# Patient Record
Sex: Male | Born: 1990 | Race: Black or African American | Hispanic: No | Marital: Single | Smoking: Never smoker
Health system: Southern US, Community
[De-identification: ages and names within clinical notes are randomized; demographics above are authoritative.]

## PROBLEM LIST (undated history)

## (undated) DIAGNOSIS — T7840XA Allergy, unspecified, initial encounter: Secondary | ICD-10-CM

## (undated) HISTORY — DX: Allergy, unspecified, initial encounter: T78.40XA

---

## 2013-08-15 ENCOUNTER — Emergency Department (HOSPITAL_COMMUNITY): Payer: Self-pay

## 2013-08-15 ENCOUNTER — Encounter (HOSPITAL_COMMUNITY): Payer: Self-pay | Admitting: Emergency Medicine

## 2013-08-15 ENCOUNTER — Emergency Department (HOSPITAL_COMMUNITY)
Admission: EM | Admit: 2013-08-15 | Discharge: 2013-08-15 | Disposition: A | Discharge status: Home / Self Care | Payer: Self-pay | Attending: Emergency Medicine | Admitting: Emergency Medicine

## 2013-08-15 DIAGNOSIS — Z79899 Other long term (current) drug therapy: Secondary | ICD-10-CM | POA: Insufficient documentation

## 2013-08-15 DIAGNOSIS — Y929 Unspecified place or not applicable: Secondary | ICD-10-CM | POA: Insufficient documentation

## 2013-08-15 DIAGNOSIS — S46909A Unspecified injury of unspecified muscle, fascia and tendon at shoulder and upper arm level, unspecified arm, initial encounter: Secondary | ICD-10-CM | POA: Insufficient documentation

## 2013-08-15 DIAGNOSIS — S4980XA Other specified injuries of shoulder and upper arm, unspecified arm, initial encounter: Secondary | ICD-10-CM | POA: Insufficient documentation

## 2013-08-15 DIAGNOSIS — X503XXA Overexertion from repetitive movements, initial encounter: Secondary | ICD-10-CM | POA: Insufficient documentation

## 2013-08-15 DIAGNOSIS — S4992XA Unspecified injury of left shoulder and upper arm, initial encounter: Secondary | ICD-10-CM

## 2013-08-15 DIAGNOSIS — Y939 Activity, unspecified: Secondary | ICD-10-CM | POA: Insufficient documentation

## 2013-08-15 MED ORDER — DIAZEPAM 5 MG PO TABS
5.0000 mg | ORAL_TABLET | Freq: Once | ORAL | Status: AC
  Administered 2013-08-15: 5 mg via ORAL
  Filled 2013-08-15: qty 1

## 2013-08-15 MED ORDER — CYCLOBENZAPRINE HCL 10 MG PO TABS: 10.0000 mg | ORAL_TABLET | Freq: Two times a day (BID) | ORAL | Status: DC | PRN

## 2013-08-15 MED ORDER — IBUPROFEN 800 MG PO TABS: 800.0000 mg | ORAL_TABLET | Freq: Three times a day (TID) | ORAL | Status: DC

## 2013-08-15 MED ORDER — KETOROLAC TROMETHAMINE 60 MG/2ML IM SOLN
60.0000 mg | Freq: Once | INTRAMUSCULAR | Status: AC
  Administered 2013-08-15: 60 mg via INTRAMUSCULAR
  Filled 2013-08-15: qty 2

## 2013-08-15 NOTE — Discharge Instructions (Signed)
Take ibuprofen as needed for pain. Take Flexeril as needed for muscle pain. Use sling as needed for comfort. Apply ice for pain. Refer to attached documents for more information.

## 2013-08-15 NOTE — ED Provider Notes (Signed)
CSN: [id number]     Arrival date & time 08/15/13  1419 History   First MD Initiated Contact with Patient 08/15/13 1436     Chief Complaint  Patient presents with  . Shoulder Pain     (Consider location/radiation/quality/duration/timing/severity/associated sxs/prior Treatment) HPI Comments: Patient is a 23 year old male who presents with left shoulder pain that started this morning. Symptoms started gradually and remained constant since the onset. The pain is described as a soreness and is moderate. No radiation. Movement of the shoulder makes the pain worse. Patient denies any injury. He did not take anything at home for pain. No alleviating factors. Patient denies any previous trauma to the left shoulder joint.    History reviewed. No pertinent past medical history. History reviewed. No pertinent past surgical history. No family history on file. History  Substance Use Topics  . Smoking status: Never Smoker   . Smokeless tobacco: Not on file  . Alcohol Use: No    Review of Systems  Constitutional: Negative for fever, chills and fatigue.  HENT: Negative for trouble swallowing.   Eyes: Negative for visual disturbance.  Respiratory: Negative for shortness of breath.   Cardiovascular: Negative for chest pain and palpitations.  Gastrointestinal: Negative for nausea, vomiting, abdominal pain and diarrhea.  Genitourinary: Negative for dysuria and difficulty urinating.  Musculoskeletal: Positive for arthralgias. Negative for neck pain.  Skin: Negative for color change.  Neurological: Negative for dizziness and weakness.  Psychiatric/Behavioral: Negative for dysphoric mood.      Allergies  Review of patient's allergies indicates no known allergies.  Home Medications   Current Outpatient Rx  Name  Route  Sig  Dispense  Refill  . Multiple Vitamins-Minerals (MULTIVITAMIN PO)   Oral   Take 1 tablet by mouth daily.          BP 137/63  Pulse 60  Temp(Src) 98.5 F (36.9 C)  (Oral)  Resp 20  Ht 5\' 11"  (1.803 m)  Wt 275 lb (124.739 kg)  BMI 38.37 kg/m2  SpO2 99% Physical Exam  Nursing note and vitals reviewed. Constitutional: He is oriented to person, place, and time. He appears well-developed and well-nourished. No distress.  HENT:  Head: Normocephalic and atraumatic.  Eyes: Conjunctivae are normal.  Neck: Normal range of motion.  Cardiovascular: Normal rate and regular rhythm.  Exam reveals no gallop and no friction rub.   No murmur heard. Pulmonary/Chest: Effort normal and breath sounds normal. He has no wheezes. He has no rales. He exhibits no tenderness.  Musculoskeletal:  Limited ROM of left shoulder due to pain. No obvious deformity. Anterior left shoulder tenderness to palpation.   Neurological: He is alert and oriented to person, place, and time. Coordination normal.  Speech is goal-oriented. Moves limbs without ataxia.   Skin: Skin is warm and dry.  Psychiatric: He has a normal mood and affect. His behavior is normal.    ED Course  Procedures (including critical care time) Labs Review Labs Reviewed - No data to display Imaging Review Dg Shoulder Left  08/15/2013   CLINICAL DATA:  Left shoulder pain.  EXAM: LEFT SHOULDER - 2+ VIEW  COMPARISON:  None.  FINDINGS: There is no evidence of fracture or dislocation. There is no evidence of arthropathy or other focal bone abnormality. Soft tissues are unremarkable.  IMPRESSION: Negative.   Electronically Signed   By: Charlett Nose M.D.   On: 08/15/2013 15:27     EKG Interpretation None      MDM  Final diagnoses:  Injury of left shoulder    4:24 PM Xray unremarkable for acute changes. Vitals stable and patient afebrile. Patient likely has muscle strain. Patient wil be discharged with a sling for comfort, ibuprofen and flexeril.     Emilia BeckKaitlyn Dejour Vos, PA-C 08/16/13 1024

## 2013-08-15 NOTE — ED Notes (Signed)
Pt presents to department for evaluation of L shoulder pain. Pt was lifting heavy boxes at work last night. Now states increased pain, increases with movement. 8/10 pain at the time. Pt is alert and oriented x4.

## 2013-08-15 NOTE — ED Notes (Signed)
Pt to xray

## 2013-08-16 NOTE — ED Provider Notes (Signed)
Medical screening examination/treatment/procedure(s) were performed by non-physician practitioner and as supervising physician I was immediately available for consultation/collaboration.  Lacoya Wilbanks T Serria Sloma, MD 08/16/13 1604 

## 2013-09-28 ENCOUNTER — Encounter (HOSPITAL_COMMUNITY): Payer: Self-pay | Admitting: Emergency Medicine

## 2013-09-28 ENCOUNTER — Emergency Department (HOSPITAL_COMMUNITY)
Admission: EM | Admit: 2013-09-28 | Discharge: 2013-09-28 | Disposition: A | Discharge status: Home / Self Care | Payer: Self-pay | Attending: Emergency Medicine | Admitting: Emergency Medicine

## 2013-09-28 DIAGNOSIS — R42 Dizziness and giddiness: Secondary | ICD-10-CM | POA: Insufficient documentation

## 2013-09-28 DIAGNOSIS — R51 Headache: Secondary | ICD-10-CM | POA: Insufficient documentation

## 2013-09-28 LAB — BASIC METABOLIC PANEL
BUN: 16 mg/dL (ref 6–23)
CALCIUM: 9.6 mg/dL (ref 8.4–10.5)
CO2: 25 mEq/L (ref 19–32)
CREATININE: 0.93 mg/dL (ref 0.50–1.35)
Chloride: 101 mEq/L (ref 96–112)
GFR calc non Af Amer: >90 mL/min (ref >90)
GLUCOSE: 98 mg/dL (ref 70–99)
POTASSIUM: 4.3 meq/L (ref 3.7–5.3)
Sodium: 138 mEq/L (ref 137–147)

## 2013-09-28 LAB — CBC
HCT: 43.9 % (ref 39.0–52.0)
Hemoglobin: 15.5 g/dL (ref 13.0–17.0)
MCH: 30.8 pg (ref 26.0–34.0)
MCHC: 35.3 g/dL (ref 30.0–36.0)
MCV: 87.1 fL (ref 78.0–100.0)
PLATELETS: 251 10*3/uL (ref 150–400)
RBC: 5.04 MIL/uL (ref 4.22–5.81)
RDW: 13.3 % (ref 11.5–15.5)
WBC: 3.7 10*3/uL — ABNORMAL LOW (ref 4.0–10.5)

## 2013-09-28 MED ORDER — SODIUM CHLORIDE 0.9 % IV BOLUS (SEPSIS): 1000.0000 mL | Freq: Once | INTRAVENOUS | Status: DC

## 2013-09-28 NOTE — ED Notes (Signed)
Pt given a Pitcher of water to drink .

## 2013-09-28 NOTE — Discharge Instructions (Signed)
Dizziness °Dizziness is a common problem. It is a feeling of unsteadiness or lightheadedness. You may feel like you are about to faint. Dizziness can lead to injury if you stumble or fall. A person of any age group can suffer from dizziness, but dizziness is more common in older adults. °CAUSES  °Dizziness can be caused by many different things, including: °· Middle ear problems. °· Standing for too long. °· Infections. °· An allergic reaction. °· Aging. °· An emotional response to something, such as the sight of blood. °· Side effects of medicines. °· Fatigue. °· Problems with circulation or blood pressure. °· Excess use of alcohol, medicines, or illegal drug use. °· Breathing too fast (hyperventilation). °· An arrhythmia or problems with your heart rhythm. °· Low red blood cell count (anemia). °· Pregnancy. °· Vomiting, diarrhea, fever, or other illnesses that cause dehydration. °· Diseases or conditions such as Parkinson's disease, high blood pressure (hypertension), diabetes, and thyroid problems. °· Exposure to extreme heat. °DIAGNOSIS  °To find the cause of your dizziness, your caregiver may do a physical exam, lab tests, radiologic imaging scans, or an electrocardiography test (ECG).  °TREATMENT  °Treatment of dizziness depends on the cause of your symptoms and can vary greatly. °HOME CARE INSTRUCTIONS  °· Drink enough fluids to keep your urine clear or pale yellow. This is especially important in very hot weather. In the elderly, it is also important in cold weather. °· If your dizziness is caused by medicines, take them exactly as directed. When taking blood pressure medicines, it is especially important to get up slowly. °· Rise slowly from chairs and steady yourself until you feel okay. °· In the morning, first sit up on the side of the bed. When this seems okay, stand slowly while holding onto something until you know your balance is fine. °· If you need to stand in one place for a long time, be sure to  move your legs often. Tighten and relax the muscles in your legs while standing. °· If dizziness continues to be a problem, have someone stay with you for a day or two. Do this until you feel you are well enough to stay alone. Have the person call your caregiver if he or she notices changes in you that are concerning. °· Do not drive or use heavy machinery if you feel dizzy. °· Do not drink alcohol. °SEEK IMMEDIATE MEDICAL CARE IF:  °· Your dizziness or lightheadedness gets worse. °· You feel nauseous or vomit. °· You develop problems with talking, walking, weakness, or using your arms, hands, or legs. °· You are not thinking clearly or you have difficulty forming sentences. It may take a friend or family member to determine if your thinking is normal. °· You develop chest pain, abdominal pain, shortness of breath, or sweating. °· Your vision changes. °· You notice any bleeding. °· You have side effects from medicine that seems to be getting worse rather than better. °MAKE SURE YOU:  °· Understand these instructions. °· Will watch your condition. °· Will get help right away if you are not doing well or get worse. °Document Released: 11/21/2000 Document Revised: 08/20/2011 Document Reviewed: 12/15/2010 °ExitCare® Patient Information ©2014 [employer]. ° ° °Emergency Department Resource Guide °1) Find a Doctor and Pay Out of Pocket °Although you won't have to find out who is covered by your insurance plan, it is a good idea to ask around and get recommendations. You will then need to call the office and see   if the doctor you have chosen will accept you as a new patient and what types of options they offer for patients who are self-pay. Some doctors offer discounts or will set up payment plans for their patients who do not have insurance, but you will need to ask so you aren't surprised when you get to your appointment. ° °2) Contact Your Local Health Department °Not all health departments have doctors that can see  patients for sick visits, but many do, so it is worth a call to see if yours does. If you don't know where your local health department is, you can check in your phone book. The CDC also has a tool to help you locate your state's health department, and many state websites also have listings of all of their local health departments. ° °3) Find a Walk-in Clinic °If your illness is not likely to be very severe or complicated, you may want to try a walk in clinic. These are popping up all over the country in pharmacies, drugstores, and shopping centers. They're usually staffed by nurse practitioners or physician assistants that have been trained to treat common illnesses and complaints. They're usually fairly quick and inexpensive. However, if you have serious medical issues or chronic medical problems, these are probably not your best option. ° °No Primary Care Doctor: °- Call [facility] at  832-8000 - they can help you locate a primary care doctor that  accepts your insurance, provides certain services, etc. °- Physician Referral Service- 1-800-533-3463 ° °Chronic Pain Problems: °Organization         Address  Phone   Notes  °[facility]  (336) 297-2271 Patients need to be referred by their primary care doctor.  ° °Medication Assistance: °Organization         Address  Phone   Notes  °[place] [facility] [address] °[place], [place] [address] (336) 641-8030 --Must be a resident of [place] °-- Must have NO insurance coverage whatsoever (no Medicaid/ Medicare, etc.) °-- The pt. MUST have a primary care doctor that directs their care regularly and follows them in the community °  °[facility]  (866) 331-1348   °[employer]  (888) 892-1162   ° °Agencies that provide inexpensive medical care: °Organization         Address  Phone   Notes  °[facility]  (336) 832-8035   °[facility]    (336) 832-7272   °[facility] [address] °[place], [place] [address] (336) 832-4777   °[facility] [address], °[place] (336) 271-4999   °[facility]    (336) 373-0678   °[facility]    (336) 272-1050   °[facility] ° [address], [place] Phone:  (336) 832-4444, Fax:  (336) 832-4440 Hours of Operation:  9 am - 6 pm, M-F.  Also accepts Medicaid/Medicare and self-pay.  °[facility] ° [address], [place] Phone: (336) 832-3150, Fax: (336) 832-3151. Hours of Operation:  8:30 am - 5:30 pm, M-F.  Also accepts Medicaid and self-pay.  °[facility] [place] [address], [place] Phone: (336) 878-6027   °[facility] [address], [place], [place] (336)723-1848, Ext. 123 Mondays & Thursdays: 7-9 AM.  First 15 patients are seen on a first come, first serve basis. °  ° °Medicaid-accepting [place] Providers: ° °  Organization         Address  Phone   Notes  °[facility] [address], [place] (336) 641-2100 Also accepts self-pay patients.  °[facility] [address], [place] ° (336) 856-9996   °[facility] [address], [place] (336) 288-8857   °[facility] [address] [place] [address], [place] (336) 299-7000   °Veita Bland [address], [place]  ° (336) 373-1557 Only accepts [place] Access Medicaid patients after they have their name applied to their card.  ° °Self-Pay (no insurance) in [place]: ° °Organization         Address  Phone   Notes  °Sickle Cell Patients, [facility] [address], [place] (336) 832-1970   °[facility] [address], [place] (336) 832-4400   ° [facility] ° [address], [place] (336) 992-4800   °[facility]/Dr. Osei-Bonsu °  [address] [place] [address], [place] or [address], [place] (336) 841-8500 Phone number for both [place] and [place] locations is the same.  °[facility] [address], [place] (336) 299-0000   °[facility] [address] [place] [address], [place] or [address] (336) 852-7530 °(336) 878-2260   °[facility] [address], [place] (336) 350-1642, phone; (336) 294-5005, fax Sees patients 1st and 3rd Saturday of every month.  Must not qualify for public or private insurance (i.e. Medicaid, Medicare, [place] Health Choice, Veterans' Benefits) • Household income should be no more than 200% of the poverty level •The clinic cannot treat you if you are pregnant or think you are pregnant • Sexually transmitted diseases are not treated at the clinic.  ° ° °Dental Care: °Organization         Address  Phone  Notes  °[facility] [address], [place] (336) 641-6152 Accepts children up to age 21 who are enrolled in Medicaid or [place] Health Choice; pregnant women with a Medicaid card; and children who have applied for Medicaid or [place] Health Choice, but were declined, whose parents can pay a reduced fee at time of service.  °[facility] [place]  [address], [place] (336) 641-7733 Accepts children up to age 21 who are enrolled in Medicaid or [place] Health Choice; pregnant women with a Medicaid card; and children who have applied for Medicaid or [place] Health Choice, but were declined, whose parents can pay a reduced fee at time of service.  °[facility] ° [address], [place] (336) 641-4533 Patients are seen by appointment only. Walk-ins are not accepted. [facility] will see patients 18 years of age and older. °Monday - Tuesday (8am-5pm) °Most Wednesdays (8:30-5pm) °$30 per visit, cash only  °[facility] ° [address], [place] (336) 641-4533 Patients are seen by appointment only. Walk-ins are not accepted. [facility] will see patients 18 years of age and older. °One Wednesday Evening (Monthly: Volunteer Based).  $30 per visit, cash only  °[facility]  (919) 537-3737 for adults; Children under age 4, call Graduate Pediatric Dentistry at (919) [phone]. Children aged 4-14, please call (919) [phone] to request a pediatric application. ° Dental services   are provided in all areas of dental care including fillings, crowns and bridges, complete and partial dentures, implants, gum treatment, root canals, and extractions. Preventive care is also provided. Treatment is provided to both adults and children. °Patients are selected via a lottery and there is often a waiting list. °  °[facility] [address], °[place] ° (336) 763-8833 [email or web address] °  °[facility] [address], [place], [place] (336)723-1848, Ext. 123 Second and Fourth Thursday of each month, opens at 6:30 AM; Clinic ends at 9 AM.  Patients are seen on a first-come first-served basis, and a limited number are seen during each clinic.  ° °[facility] ° [address], [place], [place] (336) 723-7904   Eligibility Requirements °You must have lived in [place], [place], or [place] counties for at least the last three months. °  You cannot be eligible for state or federal sponsored healthcare insurance, including Veterans Administration, Medicaid, or Medicare. °  You generally cannot be eligible for healthcare insurance through your employer.  °  How to apply: °Eligibility screenings are held every Tuesday and Wednesday afternoon from 1:00 pm until 4:00 pm. You do not need an appointment for the interview!  °[facility] [address], [place], [place] 336-631-2330   °[facility]  336-342-8273   °[facility]   336-703-3100   ° [facility]  336-570-6415   ° °Behavioral Health Resources in the Community: °Intensive Outpatient Programs °Organization         Address  Phone  Notes  °[facility] [address], [place], [place] 336-878-6098   °[facility] [address], [place], [place] 336-832-9800   °[facility] [address], [place], [place] ° 336-882-2125   °[facility] [address],  °[place], [place] 1-800-853-5163 or 336-641-4981   °Substance Abuse Resources °Organization         Address  Phone  Notes  °[facility]  336-882-2125   °[facility]  336-784-9470   °[facility]  336-285-9073   °[facility]  336-845-3988   °Residential & Outpatient Substance Abuse Program  1-800-659-3381   °Psychological Services °Organization         Address  Phone  Notes  °[facility]  336- 832-9600   °[facility]  336- 378-7881   °[facility] [address], [place] 1-800-853-5163 or 336-641-4981   ° °Mobile Crisis Teams °Organization         Address  Phone  Notes  °[facility], Mobile Crisis Care Unit  1-877-626-1772   °Assertive °[facility] ° [address] [place], [place] 336-834-9664   °Sharon DeEsch [address] °[place] 336-554-5454   ° °Self-Help/Support Groups °Organization         Address  Phone             Notes  °[facility] - variety of support groups  336- 373-1402 Call for more information  °Narcotics Anonymous (NA), [facility] [address], °[place]  2 meetings at this location  ° °Residential Treatment Programs °Organization         Address  Phone  Notes  °[facility] [address],    °   1-866-801-8205   °[facility] ° [address], [place],  704-293-8524   °  [facility] [address], [place] 336-845-3988 Admissions: 8am-3pm M-F  °[facility] [address]    °[place], [place] 336-841-1104   °[facility] [address], [place], [place] 336-379-7146   °[facility] [address]  °[place], [place] 336-285-9073   °[facility] Intensive Outpatient [address], [place], [place] 336-852-3033   °[facility] [address]  °[place], [place] 1-877-615-2722 or 336-784-9470   °[facility] ([facility] [address] [place], [place] 336-227-7417 Accepts Medicaid  °[facility] [address]  ° [place] 1-800-659-3381 Substance Abuse/Addiction Treatment  ° °[place] Behavioral Health Resources °Organization         Address  Phone  Notes  °[facility]  (888) 581-9988   °Julie Brannon, PhD [address] [place], [employer] [place]   (336) 349-5553 or (336) 951-0000   °[facility]   [address] °[place], [place] (336) 349-4454   °[facility] [address], [place], [place] (336) 342-8316 Insurance/Medicaid/sponsorship through [facility]  °[facility] [address]                                    [place], [place] (336) 342-8316 Therapy/tele-psych/case  °[facility] [address]  ° [place], [place] (336) 349-2233    °Dr. Arfeen  (336) 349-4544   °[facility] [place]  [employer] [place] [facility] 1) [address], [place] °2) [address], [place] °3)  [address], [place] (336) 349-3220 °(336) 342-7768 ° °(336) 342-8140   °[place] [facility] (336) 342-1394 or (336) 342-3537 (After Hours)    ° ° ° °

## 2013-09-28 NOTE — ED Notes (Signed)
Pt reports headache and dizziness with standing for the last week. Pt states his symptoms started while standing at work. States he took a few days off work hoping to feel better but again had symptoms of dizziness so much so that he fell today. Pt denies LOC. Pt currently awake, alert, oriented x4, NAD at present.

## 2013-09-29 NOTE — ED Provider Notes (Signed)
CSN: [id number]     Arrival date & time 09/28/13  1427 History   First MD Initiated Contact with Patient 09/28/13 1730     Chief Complaint  Patient presents with  . Headache  . Dizziness     (Consider location/radiation/quality/duration/timing/severity/associated sxs/prior Treatment) Patient is a 23 y.o. male presenting with headaches and dizziness. The history is provided by the patient.  Headache Associated symptoms: dizziness   Associated symptoms: no abdominal pain, no cough, no fever and no vomiting   Dizziness Quality:  Lightheadedness Severity:  Mild Onset quality:  Gradual Duration:  1 week Timing:  Intermittent Progression:  Worsening Chronicity:  New Context: physical activity and standing up   Relieved by:  Lying down Worsened by:  Nothing tried Associated symptoms: headaches   Associated symptoms: no shortness of breath and no vomiting     History reviewed. No pertinent past medical history. History reviewed. No pertinent past surgical history. History reviewed. No pertinent family history. History  Substance Use Topics  . Smoking status: Never Smoker   . Smokeless tobacco: Not on file  . Alcohol Use: No    Review of Systems  Constitutional: Negative for fever.  Respiratory: Negative for cough and shortness of breath.   Gastrointestinal: Negative for vomiting and abdominal pain.  Neurological: Positive for dizziness and headaches.  All other systems reviewed and are negative.     Allergies  Review of patient's allergies indicates no known allergies.  Home Medications   Prior to Admission medications   Medication Sig Start Date End Date Taking? Authorizing Provider  Multiple Vitamins-Minerals (MULTIVITAMIN PO) Take 1 tablet by mouth daily.   Yes Historical Provider, MD   BP 120/79  Pulse 56  Temp(Src) 97 F (36.1 C) (Oral)  Resp 24  Wt 270 lb (122.471 kg)  SpO2 98% Physical Exam  Nursing note and vitals reviewed. Constitutional: He is  oriented to person, place, and time. He appears well-developed and well-nourished. No distress.  HENT:  Head: Normocephalic and atraumatic.  Mouth/Throat: No oropharyngeal exudate.  Eyes: EOM are normal. Pupils are equal, round, and reactive to light.  Neck: Normal range of motion. Neck supple.  Cardiovascular: Normal rate and regular rhythm.  Exam reveals no friction rub.   No murmur heard. Pulmonary/Chest: Effort normal and breath sounds normal. No respiratory distress. He has no wheezes. He has no rales.  Abdominal: He exhibits no distension. There is no tenderness. There is no rebound.  Musculoskeletal: Normal range of motion. He exhibits no edema.  Neurological: He is alert and oriented to person, place, and time. No cranial nerve deficit or sensory deficit. He exhibits normal muscle tone. Coordination and gait normal. GCS eye subscore is 4. GCS verbal subscore is 5. GCS motor subscore is 6.  Skin: He is not diaphoretic.    ED Course  Procedures (including critical care time) Labs Review Labs Reviewed  CBC - Abnormal; Notable for the following:    WBC 3.7 (*)    All other components within normal limits  BASIC METABOLIC PANEL    Imaging Review No results found.   EKG Interpretation   Date/Time:  Monday September 28 2013 19:49:11 EDT Ventricular Rate:  56 PR Interval:  186 QRS Duration: 90 QT Interval:  428 QTC Calculation: 413 R Axis:   56 Text Interpretation:  Sinus bradycardia with marked sinus arrhythmia  Cannot rule out Anterior infarct , age undetermined Abnormal ECG No prior  Confirmed by Gwendolyn GrantWALDEN  MD, Loghan Kurtzman ([id number]) on 09/29/2013 1:24:06 AM  MDM   Final diagnoses:  Dizziness    [age] year old male here with dizziness. Headache and dizziness at the end of his work shift after sitting his feet all day. States she could be dehydrated. Had some dizziness during a basketball game fall working out. No chest pain, no syncope. No history of early cardiac events. EKG here  is normal. Orthostatics are normal. Labs normal, no electrolyte or hemoglobin abnormalities. Feeling better after fluids. Stable for discharge, encourage by mouth hydration, followup with PCP if symptoms persist    Dagmar HaitWilliam Azaryah Oleksy, MD 09/29/13 [phone]

## 2014-02-03 ENCOUNTER — Emergency Department (HOSPITAL_COMMUNITY): Payer: Self-pay

## 2014-02-03 ENCOUNTER — Encounter (HOSPITAL_COMMUNITY): Payer: Self-pay | Admitting: Emergency Medicine

## 2014-02-03 ENCOUNTER — Emergency Department (HOSPITAL_COMMUNITY)
Admission: EM | Admit: 2014-02-03 | Discharge: 2014-02-03 | Disposition: A | Discharge status: Home / Self Care | Payer: Self-pay | Attending: Family Medicine | Admitting: Family Medicine

## 2014-02-03 DIAGNOSIS — Y92838 Other recreation area as the place of occurrence of the external cause: Secondary | ICD-10-CM

## 2014-02-03 DIAGNOSIS — Z791 Long term (current) use of non-steroidal anti-inflammatories (NSAID): Secondary | ICD-10-CM | POA: Insufficient documentation

## 2014-02-03 DIAGNOSIS — Y93B9 Activity, other involving muscle strengthening exercises: Secondary | ICD-10-CM | POA: Insufficient documentation

## 2014-02-03 DIAGNOSIS — IMO0002 Reserved for concepts with insufficient information to code with codable children: Secondary | ICD-10-CM | POA: Insufficient documentation

## 2014-02-03 DIAGNOSIS — S6990XA Unspecified injury of unspecified wrist, hand and finger(s), initial encounter: Secondary | ICD-10-CM

## 2014-02-03 DIAGNOSIS — S5001XA Contusion of right elbow, initial encounter: Secondary | ICD-10-CM

## 2014-02-03 DIAGNOSIS — Y9239 Other specified sports and athletic area as the place of occurrence of the external cause: Secondary | ICD-10-CM | POA: Insufficient documentation

## 2014-02-03 DIAGNOSIS — S5000XA Contusion of unspecified elbow, initial encounter: Secondary | ICD-10-CM | POA: Insufficient documentation

## 2014-02-03 DIAGNOSIS — S59909A Unspecified injury of unspecified elbow, initial encounter: Secondary | ICD-10-CM | POA: Insufficient documentation

## 2014-02-03 DIAGNOSIS — S59919A Unspecified injury of unspecified forearm, initial encounter: Secondary | ICD-10-CM

## 2014-02-03 MED ORDER — MELOXICAM 15 MG PO TABS: 15.0000 mg | ORAL_TABLET | Freq: Every day | ORAL | Status: DC

## 2014-02-03 NOTE — Discharge Instructions (Signed)
Elbow Contusion An elbow contusion is a deep bruise of the elbow. Contusions are the result of an injury that caused bleeding under the skin. The contusion may turn blue, purple, or yellow. Minor injuries will give you a painless contusion, but more severe contusions may stay painful and swollen for a few weeks.  CAUSES  An elbow contusion comes from a direct force to that area, such as falling on the elbow. SYMPTOMS   Swelling and redness of the elbow.  Bruising of the elbow area.  Tenderness or soreness of the elbow. DIAGNOSIS  You will have a physical exam and will be asked about your history. You may need an X-ray of your elbow to look for a broken bone (fracture).  TREATMENT  A sling or splint may be needed to support your injury. Resting, elevating, and applying cold compresses to the elbow area are often the best treatments for an elbow contusion. Over-the-counter medicines may also be recommended for pain control. HOME CARE INSTRUCTIONS   Put ice on the injured area.  Put ice in a plastic bag.  Place a towel between your skin and the bag.  Leave the ice on for 15-20 minutes, 03-04 times a day.  Only take over-the-counter or prescription medicines for pain, discomfort, or fever as directed by your caregiver.  Rest your injured elbow until the pain and swelling are better.  Elevate your elbow to reduce swelling.  Apply a compression wrap as directed by your caregiver. This can help reduce swelling and motion. You may remove the wrap for sleeping, showers, and baths. If your fingers become numb, cold, or blue, take the wrap off and reapply it more loosely.  Use your elbow only as directed by your caregiver. You may be asked to do range of motion exercises. Do them as directed.  See your caregiver as directed. It is very important to keep all follow-up appointments in order to avoid any long-term problems with your elbow, including chronic pain or inability to move your elbow  normally. SEEK IMMEDIATE MEDICAL CARE IF:   You have increased redness, swelling, or pain in your elbow.  Your swelling or pain is not relieved with medicines.  You have swelling of the hand and fingers.  You are unable to move your fingers or wrist.  You begin to lose feeling in your hand or fingers.  Your fingers or hand become cold or blue. MAKE SURE YOU:   Understand these instructions.  Will watch your condition.  Will get help right away if you are not doing well or get worse. Document Released: 05/06/2006 Document Revised: 08/20/2011 Document Reviewed: 04/13/2011 [employer] Patient Information 2015 [employer]. This information is not intended to replace advice given to you by your health care provider. Make sure you discuss any questions you have with your health care provider.  

## 2014-02-03 NOTE — ED Notes (Signed)
Pt c/o pain right antecubital pain since working out last week. Also struck the right elbow on bed rail this am. Pain still only in antecub space.

## 2014-02-03 NOTE — ED Notes (Signed)
Per pt sts he has been having right arm pain x 1 week while working out in the gym. sts this am he fell on his right elbow and now its hurting.

## 2014-02-03 NOTE — ED Provider Notes (Signed)
CSN: [id number]     Arrival date & time 02/03/14  1057 History   First MD Initiated Contact with Patient 02/03/14 1132     Chief Complaint  Patient presents with  . Arm Pain     (Consider location/radiation/quality/duration/timing/severity/associated sxs/prior Treatment) HPI Comments: 23 year old male presents complaining of right elbow pain. Initially he was having pain in his right biceps after lifting weights at the gym one week ago, this pain has gotten slightly better but it persists. This morning he hit the back of his elbow on something and now is having pain in that area as well. The pain is constant and is worse with attempted extension of the elbow. No numbness or weakness. No other injury. No swelling.  Patient is a 23 y.o. male presenting with arm pain.  Arm Pain Associated symptoms include arthralgias (see HPI).    History reviewed. No pertinent past medical history. History reviewed. No pertinent past surgical history. History reviewed. No pertinent family history. History  Substance Use Topics  . Smoking status: Never Smoker   . Smokeless tobacco: Not on file  . Alcohol Use: No    Review of Systems  Musculoskeletal: Positive for arthralgias (see HPI).  All other systems reviewed and are negative.     Allergies  Review of patient's allergies indicates no known allergies.  Home Medications   Prior to Admission medications   Medication Sig Start Date End Date Taking? Authorizing Provider  meloxicam (MOBIC) 15 MG tablet Take 1 tablet (15 mg total) by mouth daily. 02/03/14   Graylon Good, PA-C  Multiple Vitamins-Minerals (MULTIVITAMIN PO) Take 1 tablet by mouth daily.    Historical Provider, MD   BP 148/74  Pulse 89  Temp(Src) 98.4 F (36.9 C) (Oral)  Resp 20  SpO2 99% Physical Exam  Nursing note and vitals reviewed. Constitutional: He is oriented to person, place, and time. He appears well-developed and well-nourished. No distress.  HENT:  Head:  Normocephalic.  Cardiovascular:  Pulses:      Radial pulses are 2+ on the right side, and 2+ on the left side.  Pulmonary/Chest: Effort normal. No respiratory distress.  Musculoskeletal:       Right elbow: He exhibits normal range of motion, no swelling, no effusion and no deformity. Tenderness (tenderness over the distal triceps tendon) found.  All biceps and triceps tendons intact with full strength and range of motion. No bony tenderness  Neurological: He is alert and oriented to person, place, and time. He has normal strength. No sensory deficit. He exhibits normal muscle tone. Coordination normal.  Skin: Skin is warm and dry. No rash noted. He is not diaphoretic.  Psychiatric: He has a normal mood and affect. Judgment normal.    ED Course  Procedures (including critical care time) Labs Review Labs Reviewed - No data to display  Imaging Review Dg Elbow Complete Right  02/03/2014   CLINICAL DATA:  Medial and posterior RIGHT elbow pain a few days after an injury at the gym, struck arm on dresser today.  EXAM: RIGHT ELBOW - COMPLETE 3+ VIEW  COMPARISON:  None  FINDINGS: Osseous mineralization normal.  Joint spaces preserved.  No fracture, dislocation, or bone destruction.  No joint effusion.  IMPRESSION: Normal exam.   Electronically Signed   By: Ulyses Southward M.D.   On: 02/03/2014 11:25     EKG Interpretation None      MDM   Final diagnoses:  Elbow contusion, right, initial encounter    Treat with  ice, rest, daily meloxicam as needed. Followup with primary care if no improvement in one week.   Meds ordered this encounter  Medications  . meloxicam (MOBIC) 15 MG tablet    Sig: Take 1 tablet (15 mg total) by mouth daily.    Dispense:  30 tablet    Refill:  0    Order Specific Question:  Supervising Provider    Answer:  MERRELL, DAVID J [[id number]]       Graylon Good, PA-C 02/03/14 1149

## 2014-02-04 NOTE — ED Provider Notes (Signed)
Medical screening examination/treatment/procedure(s) were performed by a resident physician or non-physician practitioner and as the supervising physician I was immediately available for consultation/collaboration.  Shelly Flatten, MD Family Medicine   Ozella Rocks, MD 02/04/14 [phone]

## 2014-04-29 ENCOUNTER — Encounter (HOSPITAL_COMMUNITY): Payer: Self-pay | Admitting: Emergency Medicine

## 2014-04-29 ENCOUNTER — Emergency Department (HOSPITAL_COMMUNITY)
Admission: EM | Admit: 2014-04-29 | Discharge: 2014-04-30 | Disposition: A | Discharge status: Home / Self Care | Payer: Self-pay | Attending: Emergency Medicine | Admitting: Emergency Medicine

## 2014-04-29 DIAGNOSIS — Y9289 Other specified places as the place of occurrence of the external cause: Secondary | ICD-10-CM | POA: Insufficient documentation

## 2014-04-29 DIAGNOSIS — Y9389 Activity, other specified: Secondary | ICD-10-CM | POA: Insufficient documentation

## 2014-04-29 DIAGNOSIS — X58XXXA Exposure to other specified factors, initial encounter: Secondary | ICD-10-CM | POA: Insufficient documentation

## 2014-04-29 DIAGNOSIS — Z79899 Other long term (current) drug therapy: Secondary | ICD-10-CM | POA: Insufficient documentation

## 2014-04-29 DIAGNOSIS — T7840XA Allergy, unspecified, initial encounter: Secondary | ICD-10-CM | POA: Insufficient documentation

## 2014-04-29 DIAGNOSIS — H109 Unspecified conjunctivitis: Secondary | ICD-10-CM | POA: Insufficient documentation

## 2014-04-29 DIAGNOSIS — Y998 Other external cause status: Secondary | ICD-10-CM | POA: Insufficient documentation

## 2014-04-29 DIAGNOSIS — Z791 Long term (current) use of non-steroidal anti-inflammatories (NSAID): Secondary | ICD-10-CM | POA: Insufficient documentation

## 2014-04-29 NOTE — ED Notes (Signed)
Pt. presents with left eye redness, itching /swelling and drainage onset this evening , no injury , no vision loss.

## 2014-04-30 MED ORDER — DIPHENHYDRAMINE HCL 25 MG PO TABS: 25.0000 mg | ORAL_TABLET | Freq: Four times a day (QID) | ORAL | Status: DC | PRN

## 2014-04-30 MED ORDER — FLUORESCEIN SODIUM 1 MG OP STRP
1.0000 | ORAL_STRIP | Freq: Once | OPHTHALMIC | Status: AC
  Administered 2014-04-30: 1 via OPHTHALMIC
  Filled 2014-04-30: qty 1

## 2014-04-30 MED ORDER — CIPROFLOXACIN HCL 0.3 % OP SOLN: 1.0000 [drp] | OPHTHALMIC | Status: DC

## 2014-04-30 MED ORDER — DIPHENHYDRAMINE HCL 25 MG PO CAPS
50.0000 mg | ORAL_CAPSULE | Freq: Once | ORAL | Status: AC
  Administered 2014-04-30: 50 mg via ORAL
  Filled 2014-04-30: qty 2

## 2014-04-30 MED ORDER — TETRACAINE HCL 0.5 % OP SOLN
1.0000 [drp] | Freq: Once | OPHTHALMIC | Status: AC
  Administered 2014-04-30: 1 [drp] via OPHTHALMIC
  Filled 2014-04-30: qty 2

## 2014-04-30 NOTE — ED Provider Notes (Signed)
CSN: [id number]     Arrival date & time 04/29/14  2339 History   First MD Initiated Contact with Patient 04/29/14 2348     Chief Complaint  Patient presents with  . Eye Drainage     (Consider location/radiation/quality/duration/timing/severity/associated sxs/prior Treatment) HPI Comments: Patient presents emergency department with a chief complaint of eyelid swelling, redness, drainage, and irritation to both eyes. States that this started suddenly tonight while he was watching a movie. He states that he took out his contacts, noticed that the eyes were tearing up. He denies any pain. Denies any fevers, or chills.  He reports that the eyes are itchy. He has not taken anything to alleviate his symptoms. He denies any changes in his vision. Denies any new exposure to allergens.  The history is provided by the patient. No language interpreter was used.    History reviewed. No pertinent past medical history. History reviewed. No pertinent past surgical history. No family history on file. History  Substance Use Topics  . Smoking status: Never Smoker   . Smokeless tobacco: Not on file  . Alcohol Use: No    Review of Systems  Constitutional: Negative for fever and chills.  Eyes: Positive for discharge, redness and itching.  Respiratory: Negative for shortness of breath.   Cardiovascular: Negative for chest pain.  Gastrointestinal: Negative for nausea, vomiting, diarrhea and constipation.  Genitourinary: Negative for dysuria.  All other systems reviewed and are negative.     Allergies  Review of patient's allergies indicates no known allergies.  Home Medications   Prior to Admission medications   Medication Sig Start Date End Date Taking? Authorizing Provider  meloxicam (MOBIC) 15 MG tablet Take 1 tablet (15 mg total) by mouth daily. 02/03/14   Graylon GoodZachary H Baker, PA-C  Multiple Vitamins-Minerals (MULTIVITAMIN PO) Take 1 tablet by mouth daily.    Historical Provider, MD   BP  139/82 mmHg  Pulse 64  Temp(Src) 98 F (36.7 C) (Oral)  Resp 18  Ht 6' (1.829 m)  Wt 267 lb (121.11 kg)  BMI 36.20 kg/m2  SpO2 98% Physical Exam  Constitutional: He is oriented to person, place, and time. He appears well-developed and well-nourished.  HENT:  Head: Normocephalic and atraumatic.  Moderately swollen left eyelid, mildly swollen right eyelid  Eyes: Conjunctivae and EOM are normal. Pupils are equal, round, and reactive to light. Right eye exhibits no discharge. Left eye exhibits no discharge. No scleral icterus.  Normal extraocular eye movement, mildly erythematous conjunctiva bilaterally  No fluorescein uptake No foreign body Normal slit lamp exam  Neck: Normal range of motion. Neck supple. No JVD present.  Cardiovascular: Normal rate, regular rhythm and normal heart sounds.  Exam reveals no gallop and no friction rub.   No murmur heard. Pulmonary/Chest: Effort normal and breath sounds normal. No respiratory distress. He has no wheezes. He has no rales. He exhibits no tenderness.  Abdominal: Soft. He exhibits no distension and no mass. There is no tenderness. There is no rebound and no guarding.  Musculoskeletal: Normal range of motion. He exhibits no edema or tenderness.  Neurological: He is alert and oriented to person, place, and time.  Skin: Skin is warm and dry.  Psychiatric: He has a normal mood and affect. His behavior is normal. Judgment and thought content normal.  Nursing note and vitals reviewed.   ED Course  Procedures (including critical care time) Labs Review Labs Reviewed - No data to display  Imaging Review No results found.   EKG  Interpretation None      MDM   Final diagnoses:  Allergic reaction, initial encounter  Conjunctivitis, unspecified laterality   Patient with sudden onset eye irritation and eyelid swelling. Will treat with Benadryl, will investigate further with fluorescein staining and slit lamp.  No fluorescein uptake,  swelling improved with Benadryl. Discharge with Cipro drops and Benadryl. Patient is stable and ready for discharge. Advised patient not wear contacts for the next week. Recommend ophthalmology follow-up. Patient is stable and ready for discharge.    Roxy Horsemanobert Tatanisha Cuthbert, PA-C 04/30/14 0105  Roxy Horsemanobert Irmalee Riemenschneider, PA-C 04/30/14 [phone]

## 2014-04-30 NOTE — ED Notes (Signed)
Patient is alert and orientedx4.  Patient was explained discharge instructions and they understood them with no questions.  The patient's significant other, Stephen HarnessDaniela Hansen is taking the patient home.

## 2014-04-30 NOTE — Discharge Instructions (Signed)
Allergies Allergies may happen from anything your body is sensitive to. This may be food, medicines, pollens, chemicals, and nearly anything around you in everyday life that produces allergens. An allergen is anything that causes an allergy producing substance. Heredity is often a factor in causing these problems. This means you may have some of the same allergies as your parents. Food allergies happen in all age groups. Food allergies are some of the most severe and life threatening. Some common food allergies are cow's milk, seafood, eggs, nuts, wheat, and soybeans. SYMPTOMS   Swelling around the mouth.  An itchy red rash or hives.  Vomiting or diarrhea.  Difficulty breathing. SEVERE ALLERGIC REACTIONS ARE LIFE-THREATENING. This reaction is called anaphylaxis. It can cause the mouth and throat to swell and cause difficulty with breathing and swallowing. In severe reactions only a trace amount of food (for example, peanut oil in a salad) may cause death within seconds. Seasonal allergies occur in all age groups. These are seasonal because they usually occur during the same season every year. They may be a reaction to molds, grass pollens, or tree pollens. Other causes of problems are house dust mite allergens, pet dander, and mold spores. The symptoms often consist of nasal congestion, a runny itchy nose associated with sneezing, and tearing itchy eyes. There is often an associated itching of the mouth and ears. The problems happen when you come in contact with pollens and other allergens. Allergens are the particles in the air that the body reacts to with an allergic reaction. This causes you to release allergic antibodies. Through a chain of events, these eventually cause you to release histamine into the blood stream. Although it is meant to be protective to the body, it is this release that causes your discomfort. This is why you were given anti-histamines to feel better. If you are unable to  pinpoint the offending allergen, it may be determined by skin or blood testing. Allergies cannot be cured but can be controlled with medicine. Hay fever is a collection of all or some of the seasonal allergy problems. It may often be treated with simple over-the-counter medicine such as diphenhydramine. Take medicine as directed. Do not drink alcohol or drive while taking this medicine. Check with your caregiver or package insert for child dosages. If these medicines are not effective, there are many new medicines your caregiver can prescribe. Stronger medicine such as nasal spray, eye drops, and corticosteroids may be used if the first things you try do not work well. Other treatments such as immunotherapy or desensitizing injections can be used if all else fails. Follow up with your caregiver if problems continue. These seasonal allergies are usually not life threatening. They are generally more of a nuisance that can often be handled using medicine. HOME CARE INSTRUCTIONS   If unsure what causes a reaction, keep a diary of foods eaten and symptoms that follow. Avoid foods that cause reactions.  If hives or rash are present:  Take medicine as directed.  You may use an over-the-counter antihistamine (diphenhydramine) for hives and itching as needed.  Apply cold compresses (cloths) to the skin or take baths in cool water. Avoid hot baths or showers. Heat will make a rash and itching worse.  If you are severely allergic:  Following a treatment for a severe reaction, hospitalization is often required for closer follow-up.  Wear a medic-alert bracelet or necklace stating the allergy.  You and your family must learn how to give adrenaline or use  an anaphylaxis kit.  If you have had a severe reaction, always carry your anaphylaxis kit or EpiPen with you. Use this medicine as directed by your caregiver if a severe reaction is occurring. Failure to do so could have a fatal outcome. SEEK MEDICAL  CARE IF:  You suspect a food allergy. Symptoms generally happen within 30 minutes of eating a food.  Your symptoms have not gone away within 2 days or are getting worse.  You develop new symptoms.  You want to retest yourself or your child with a food or drink you think causes an allergic reaction. Never do this if an anaphylactic reaction to that food or drink has happened before. Only do this under the care of a caregiver. SEEK IMMEDIATE MEDICAL CARE IF:   You have difficulty breathing, are wheezing, or have a tight feeling in your chest or throat.  You have a swollen mouth, or you have hives, swelling, or itching all over your body.  You have had a severe reaction that has responded to your anaphylaxis kit or an EpiPen. These reactions may return when the medicine has worn off. These reactions should be considered life threatening. MAKE SURE YOU:   Understand these instructions.  Will watch your condition.  Will get help right away if you are not doing well or get worse. Document Released: 08/21/2002 Document Revised: 09/22/2012 Document Reviewed: 01/26/2008 [facility] Patient Information 2015 [place], [place]. This information is not intended to replace advice given to you by your health care provider. Make sure you discuss any questions you have with your health care provider. Conjunctivitis Conjunctivitis is commonly called "pink eye." Conjunctivitis can be caused by bacterial or viral infection, allergies, or injuries. There is usually redness of the lining of the eye, itching, discomfort, and sometimes discharge. There may be deposits of matter along the eyelids. A viral infection usually causes a watery discharge, while a bacterial infection causes a yellowish, thick discharge. Pink eye is very contagious and spreads by direct contact. You may be given antibiotic eyedrops as part of your treatment. Before using your eye medicine, remove all drainage from the eye by washing gently  with warm water and cotton balls. Continue to use the medication until you have awakened 2 mornings in a row without discharge from the eye. Do not rub your eye. This increases the irritation and helps spread infection. Use separate towels from other household members. Wash your hands with soap and water before and after touching your eyes. Use cold compresses to reduce pain and sunglasses to relieve irritation from light. Do not wear contact lenses or wear eye makeup until the infection is gone. SEEK MEDICAL CARE IF:   Your symptoms are not better after 3 days of treatment.  You have increased pain or trouble seeing.  The outer eyelids become very red or swollen. Document Released: 07/05/2004 Document Revised: 08/20/2011 Document Reviewed: 05/28/2005 [facility] Patient Information 2015 [place], [place]. This information is not intended to replace advice given to you by your health care provider. Make sure you discuss any questions you have with your health care provider.

## 2014-04-30 NOTE — ED Notes (Signed)
The patient needs glasses to read and he does not have them with him.  I am unable to do the visual acuity test.

## 2014-06-07 ENCOUNTER — Emergency Department (HOSPITAL_COMMUNITY): Payer: Self-pay

## 2014-06-07 ENCOUNTER — Emergency Department (HOSPITAL_COMMUNITY)
Admission: EM | Admit: 2014-06-07 | Discharge: 2014-06-07 | Disposition: A | Discharge status: Home / Self Care | Payer: Self-pay | Attending: Emergency Medicine | Admitting: Emergency Medicine

## 2014-06-07 ENCOUNTER — Encounter (HOSPITAL_COMMUNITY): Payer: Self-pay | Admitting: Emergency Medicine

## 2014-06-07 DIAGNOSIS — Z79899 Other long term (current) drug therapy: Secondary | ICD-10-CM | POA: Insufficient documentation

## 2014-06-07 DIAGNOSIS — R112 Nausea with vomiting, unspecified: Secondary | ICD-10-CM | POA: Insufficient documentation

## 2014-06-07 DIAGNOSIS — R109 Unspecified abdominal pain: Secondary | ICD-10-CM | POA: Insufficient documentation

## 2014-06-07 DIAGNOSIS — Z792 Long term (current) use of antibiotics: Secondary | ICD-10-CM | POA: Insufficient documentation

## 2014-06-07 LAB — URINALYSIS, ROUTINE W REFLEX MICROSCOPIC
Bilirubin Urine: NEGATIVE
GLUCOSE, UA: NEGATIVE mg/dL
HGB URINE DIPSTICK: NEGATIVE
Ketones, ur: NEGATIVE mg/dL
LEUKOCYTES UA: NEGATIVE
Nitrite: NEGATIVE
Protein, ur: 30 mg/dL — AB
Specific Gravity, Urine: 1.033 — ABNORMAL HIGH (ref 1.005–1.030)
Urobilinogen, UA: 0.2 mg/dL (ref 0.0–1.0)
pH: 8 (ref 5.0–8.0)

## 2014-06-07 LAB — COMPREHENSIVE METABOLIC PANEL
ALK PHOS: 50 U/L (ref 39–117)
ALT: 33 U/L (ref 0–53)
AST: 34 U/L (ref 0–37)
Albumin: 4.2 g/dL (ref 3.5–5.2)
Anion gap: 8 (ref 5–15)
BUN: 14 mg/dL (ref 6–23)
CO2: 28 mmol/L (ref 19–32)
Calcium: 9.3 mg/dL (ref 8.4–10.5)
Chloride: 100 mEq/L (ref 96–112)
Creatinine, Ser: 1.04 mg/dL (ref 0.50–1.35)
GFR calc Af Amer: >90 mL/min (ref >90)
Glucose, Bld: 125 mg/dL — ABNORMAL HIGH (ref 70–99)
POTASSIUM: 4.3 mmol/L (ref 3.5–5.1)
SODIUM: 136 mmol/L (ref 135–145)
Total Bilirubin: 0.8 mg/dL (ref 0.3–1.2)
Total Protein: 7.8 g/dL (ref 6.0–8.3)

## 2014-06-07 LAB — LIPASE, BLOOD: Lipase: 23 U/L (ref 11–59)

## 2014-06-07 LAB — CBC WITH DIFFERENTIAL/PLATELET
BASOS ABS: 0 10*3/uL (ref 0.0–0.1)
Basophils Relative: 0 % (ref 0–1)
EOS PCT: 0 % (ref 0–5)
Eosinophils Absolute: 0 10*3/uL (ref 0.0–0.7)
HCT: 46.9 % (ref 39.0–52.0)
Hemoglobin: 16 g/dL (ref 13.0–17.0)
LYMPHS ABS: 0.3 10*3/uL — AB (ref 0.7–4.0)
LYMPHS PCT: 5 % — AB (ref 12–46)
MCH: 30 pg (ref 26.0–34.0)
MCHC: 34.1 g/dL (ref 30.0–36.0)
MCV: 87.8 fL (ref 78.0–100.0)
Monocytes Absolute: 0.3 10*3/uL (ref 0.1–1.0)
Monocytes Relative: 4 % (ref 3–12)
NEUTROS ABS: 5.6 10*3/uL (ref 1.7–7.7)
NEUTROS PCT: 91 % — AB (ref 43–77)
PLATELETS: 212 10*3/uL (ref 150–400)
RBC: 5.34 MIL/uL (ref 4.22–5.81)
RDW: 13.5 % (ref 11.5–15.5)
WBC: 6.2 10*3/uL (ref 4.0–10.5)

## 2014-06-07 LAB — URINE MICROSCOPIC-ADD ON

## 2014-06-07 MED ORDER — ACETAMINOPHEN 325 MG PO TABS
650.0000 mg | ORAL_TABLET | Freq: Once | ORAL | Status: AC
  Administered 2014-06-07: 650 mg via ORAL
  Filled 2014-06-07: qty 2

## 2014-06-07 MED ORDER — ONDANSETRON HCL 4 MG/2ML IJ SOLN
4.0000 mg | Freq: Once | INTRAMUSCULAR | Status: DC
  Filled 2014-06-07: qty 2

## 2014-06-07 MED ORDER — SODIUM CHLORIDE 0.9 % IV BOLUS (SEPSIS): 1000.0000 mL | Freq: Once | INTRAVENOUS | Status: DC

## 2014-06-07 MED ORDER — ONDANSETRON 4 MG PO TBDP
8.0000 mg | ORAL_TABLET | Freq: Once | ORAL | Status: AC
  Administered 2014-06-07: 8 mg via ORAL
  Filled 2014-06-07: qty 2

## 2014-06-07 MED ORDER — ONDANSETRON HCL 4 MG PO TABS: 4.0000 mg | ORAL_TABLET | Freq: Four times a day (QID) | ORAL | Status: DC

## 2014-06-07 MED ORDER — KETOROLAC TROMETHAMINE 60 MG/2ML IM SOLN
60.0000 mg | Freq: Once | INTRAMUSCULAR | Status: AC
  Administered 2014-06-07: 60 mg via INTRAMUSCULAR
  Filled 2014-06-07: qty 2

## 2014-06-07 NOTE — Discharge Instructions (Signed)
Nausea and Vomiting Take the nausea medicine as prescribed. Follow-up with the [facility] Center. Return to the ED if you develop new or worsening symptoms. Nausea is a sick feeling that often comes before throwing up (vomiting). Vomiting is a reflex where stomach contents come out of your mouth. Vomiting can cause severe loss of body fluids (dehydration). Children and elderly adults can become dehydrated quickly, especially if they also have diarrhea. Nausea and vomiting are symptoms of a condition or disease. It is important to find the cause of your symptoms. CAUSES   Direct irritation of the stomach lining. This irritation can result from increased acid production (gastroesophageal reflux disease), infection, food poisoning, taking certain medicines (such as nonsteroidal anti-inflammatory drugs), alcohol use, or tobacco use.  Signals from the brain.These signals could be caused by a headache, heat exposure, an inner ear disturbance, increased pressure in the brain from injury, infection, a tumor, or a concussion, pain, emotional stimulus, or metabolic problems.  An obstruction in the gastrointestinal tract (bowel obstruction).  Illnesses such as diabetes, hepatitis, gallbladder problems, appendicitis, kidney problems, cancer, sepsis, atypical symptoms of a heart attack, or eating disorders.  Medical treatments such as chemotherapy and radiation.  Receiving medicine that makes you sleep (general anesthetic) during surgery. DIAGNOSIS Your caregiver may ask for tests to be done if the problems do not improve after a few days. Tests may also be done if symptoms are severe or if the reason for the nausea and vomiting is not clear. Tests may include:  Urine tests.  Blood tests.  Stool tests.  Cultures (to look for evidence of infection).  X-rays or other imaging studies. Test results can help your caregiver make decisions about treatment or the need for additional tests. TREATMENT You need  to stay well hydrated. Drink frequently but in small amounts.You may wish to drink water, sports drinks, clear broth, or eat frozen ice pops or gelatin dessert to help stay hydrated.When you eat, eating slowly may help prevent nausea.There are also some antinausea medicines that may help prevent nausea. HOME CARE INSTRUCTIONS   Take all medicine as directed by your caregiver.  If you do not have an appetite, do not force yourself to eat. However, you must continue to drink fluids.  If you have an appetite, eat a normal diet unless your caregiver tells you differently.  Eat a variety of complex carbohydrates (rice, wheat, potatoes, bread), lean meats, yogurt, fruits, and vegetables.  Avoid high-fat foods because they are more difficult to digest.  Drink enough water and fluids to keep your urine clear or pale yellow.  If you are dehydrated, ask your caregiver for specific rehydration instructions. Signs of dehydration may include:  Severe thirst.  Dry lips and mouth.  Dizziness.  Dark urine.  Decreasing urine frequency and amount.  Confusion.  Rapid breathing or pulse. SEEK IMMEDIATE MEDICAL CARE IF:   You have blood or brown flecks (like coffee grounds) in your vomit.  You have black or bloody stools.  You have a severe headache or stiff neck.  You are confused.  You have severe abdominal pain.  You have chest pain or trouble breathing.  You do not urinate at least once every 8 hours.  You develop cold or clammy skin.  You continue to vomit for longer than 24 to 48 hours.  You have a fever. MAKE SURE YOU:   Understand these instructions.  Will watch your condition.  Will get help right away if you are not doing well or  get worse. Document Released: 05/28/2005 Document Revised: 08/20/2011 Document Reviewed: 10/25/2010 [facility] Patient Information 2015 [place], [place]. This information is not intended to replace advice given to you by your health care  provider. Make sure you discuss any questions you have with your health care provider.

## 2014-06-07 NOTE — ED Provider Notes (Signed)
CSN: [id number]     Arrival date & time 06/07/14  1155 History   First MD Initiated Contact with Patient 06/07/14 1539     Chief Complaint  Patient presents with  . Emesis  . Dysuria  . Flank Pain     (Consider location/radiation/quality/duration/timing/severity/associated sxs/prior Treatment) HPI Comments: Patient presents with low back pain, nausea and vomiting and diffuse abdominal pain onset this morning. He notes 2 episodes of emesis throughout the day today with gradual onset headache. Denies fever. Has had sick contacts at work. Felt well when he went to bed last night. No chest pain or shortness of breath. No dysuria hematuria. No testicular pain. No cough, runny nose or sore throat. No history previous history of back problems.  Patient is a 23 y.o. male presenting with dysuria and flank pain. The history is provided by the patient.  Dysuria Pertinent negatives include no chest pain, no abdominal pain, no headaches and no shortness of breath.  Flank Pain Pertinent negatives include no chest pain, no abdominal pain, no headaches and no shortness of breath.    History reviewed. No pertinent past medical history. History reviewed. No pertinent past surgical history. History reviewed. No pertinent family history. History  Substance Use Topics  . Smoking status: Never Smoker   . Smokeless tobacco: Not on file  . Alcohol Use: Yes    Review of Systems  Constitutional: Positive for activity change, appetite change and fatigue. Negative for fever.  HENT: Negative for congestion and rhinorrhea.   Respiratory: Negative for cough, chest tightness and shortness of breath.   Cardiovascular: Negative for chest pain.  Gastrointestinal: Positive for nausea and vomiting. Negative for abdominal pain, diarrhea and constipation.  Genitourinary: Positive for dysuria and flank pain.  Musculoskeletal: Positive for myalgias, back pain and arthralgias.  Skin: Negative for rash and wound.   Neurological: Positive for weakness. Negative for headaches.  A complete 10 system review of systems was obtained and all systems are negative except as noted in the HPI and PMH.      Allergies  Review of patient's allergies indicates no known allergies.  Home Medications   Prior to Admission medications   Medication Sig Start Date End Date Taking? Authorizing Provider  Multiple Vitamins-Minerals (MULTIVITAMIN PO) Take 1 tablet by mouth daily.   Yes Historical Provider, MD  ciprofloxacin (CILOXAN) 0.3 % ophthalmic solution Place 1 drop into both eyes every 4 (four) hours while awake. Administer 1 drop, every 2 hours, while awake, for 2 days. Then 1 drop, every 4 hours, while awake, for the next 5 days. 04/30/14   Roxy Horsemanobert Browning, PA-C  ondansetron (ZOFRAN) 4 MG tablet Take 1 tablet (4 mg total) by mouth every 6 (six) hours. 06/07/14   Glynn OctaveStephen Fredi Geiler, MD   BP 124/70 mmHg  Pulse 86  Temp(Src) 99.5 F (37.5 C) (Oral)  Resp 16  Ht 6' (1.829 m)  Wt 270 lb (122.471 kg)  BMI 36.61 kg/m2  SpO2 98% Physical Exam  Constitutional: He is oriented to person, place, and time. He appears well-developed and well-nourished. No distress.  HENT:  Head: Normocephalic and atraumatic.  Mouth/Throat: Oropharynx is clear and moist. No oropharyngeal exudate.  Dry mucus membranes  Eyes: Conjunctivae and EOM are normal. Pupils are equal, round, and reactive to light.  Neck: Normal range of motion. Neck supple.  No meningismus.  Cardiovascular: Normal rate, regular rhythm, normal heart sounds and intact distal pulses.   No murmur heard. Pulmonary/Chest: Effort normal and breath sounds normal. No  respiratory distress.  Abdominal: Soft. There is no tenderness. There is no rebound and no guarding.  Musculoskeletal: Normal range of motion. He exhibits tenderness. He exhibits no edema.  TTP lumbar spine.  No CVAT  Neurological: He is alert and oriented to person, place, and time. No cranial nerve deficit.  He exhibits normal muscle tone. Coordination normal.  No ataxia on finger to nose bilaterally. No pronator drift. 5/5 strength throughout. CN 2-12 intact. Negative Romberg. Equal grip strength. Sensation intact. Gait is normal.   Skin: Skin is warm.  Psychiatric: He has a normal mood and affect. His behavior is normal.  Nursing note and vitals reviewed.   ED Course  Procedures (including critical care time) Labs Review Labs Reviewed  CBC WITH DIFFERENTIAL - Abnormal; Notable for the following:    Neutrophils Relative % 91 (*)    Lymphocytes Relative 5 (*)    Lymphs Abs 0.3 (*)    All other components within normal limits  COMPREHENSIVE METABOLIC PANEL - Abnormal; Notable for the following:    Glucose, Bld 125 (*)    All other components within normal limits  URINALYSIS, ROUTINE W REFLEX MICROSCOPIC - Abnormal; Notable for the following:    Color, Urine AMBER (*)    Specific Gravity, Urine 1.033 (*)    Protein, ur 30 (*)    All other components within normal limits  URINE MICROSCOPIC-ADD ON - Abnormal; Notable for the following:    Bacteria, UA FEW (*)    All other components within normal limits  LIPASE, BLOOD    Imaging Review Ct Abdomen Pelvis Wo Contrast  06/07/2014   CLINICAL DATA:  Mid back pain, dysuria, flank pain  EXAM: CT ABDOMEN AND PELVIS WITHOUT CONTRAST  TECHNIQUE: Multidetector CT imaging of the abdomen and pelvis was performed following the standard protocol without IV contrast.  COMPARISON:  None.  FINDINGS: Lower chest:  Lung bases are clear.  Hepatobiliary: Unenhanced liver is unremarkable.  Gallbladder is Within normal limits. No intrahepatic or extrahepatic ductal dilatation.  Pancreas: Within normal limits.  Spleen: Within normal limits.  Adrenals/Urinary Tract: Adrenal glands are unremarkable.  Kidneys are within normal limits. No renal calculi or hydronephrosis.  No ureteral or bladder calculi.  Bladder is within normal limits.  Stomach/Bowel: Stomach is  unremarkable.  No evidence of bowel obstruction.  Normal appendix.  Vascular/Lymphatic: No evidence of abdominal aortic aneurysm.  No suspicious abdominopelvic lymphadenopathy.  Reproductive: Prostate is unremarkable.  Other: Small fat containing right inguinal hernia.  Calcified right pelvic phleboliths.  Musculoskeletal: Visualized osseous structures are within normal limits.  IMPRESSION: No renal, ureteral, or bladder calculi.  No hydronephrosis.  No evidence of bowel obstruction.  Normal appendix.  Small fat containing right inguinal hernia.   Electronically Signed   By: Charline Bills M.D.   On: 06/07/2014 18:19     EKG Interpretation None      MDM   Final diagnoses:  Flank pain  Nausea and vomiting, vomiting of unspecified type   Back pain nausea vomiting since waking up this morning. No distress. Abdomen soft.  Urinalysis negative. Labs unremarkable.  IV access unsuccessful. Patient tolerating by mouth. No clinical evidence of dehydration. IV team accidentally had arterial puncture of right brachial artery. Pressure held for 5 minutes after IV removal. No evidence of hematoma.  Patient feels improved after Toradol and by mouth fluids. No evidence of renal stone on imaging. Suspect viral syndrome causing nausea vomiting and back pain. Headache and abdominal pain have resolved.  Discussed supportive care at home with Zofran and pain control with anti-inflammatories. Return precautions discussed.    Glynn OctaveStephen Kandance Yano, MD 06/07/14 [phone]

## 2014-06-07 NOTE — ED Notes (Signed)
Pt c/o abd pain with N/V and flank pain with some dysuria

## 2014-06-07 NOTE — ED Notes (Signed)
Pt returned to CT. 

## 2014-06-07 NOTE — ED Notes (Signed)
unsuccessful IV attempts x 2. IV team at bedside

## 2014-10-15 ENCOUNTER — Ambulatory Visit: Payer: Self-pay

## 2014-11-30 ENCOUNTER — Emergency Department (HOSPITAL_COMMUNITY)
Admission: EM | Admit: 2014-11-30 | Discharge: 2014-11-30 | Disposition: A | Discharge status: Home / Self Care | Payer: Self-pay | Attending: Emergency Medicine | Admitting: Emergency Medicine

## 2014-11-30 ENCOUNTER — Encounter (HOSPITAL_COMMUNITY): Payer: Self-pay | Admitting: *Deleted

## 2014-11-30 DIAGNOSIS — H16013 Central corneal ulcer, bilateral: Secondary | ICD-10-CM | POA: Insufficient documentation

## 2014-11-30 MED ORDER — FLUORESCEIN SODIUM 1 MG OP STRP
ORAL_STRIP | OPHTHALMIC | Status: AC
  Filled 2014-11-30: qty 1

## 2014-11-30 MED ORDER — TETRACAINE HCL 0.5 % OP SOLN
2.0000 [drp] | Freq: Once | OPHTHALMIC | Status: AC
  Administered 2014-11-30: 2 [drp] via OPHTHALMIC
  Filled 2014-11-30: qty 2

## 2014-11-30 MED ORDER — CIPROFLOXACIN HCL 0.3 % OP SOLN: 1.0000 [drp] | OPHTHALMIC | Status: DC

## 2014-11-30 NOTE — ED Provider Notes (Signed)
CSN: [id number]     Arrival date & time 11/30/14  0707 History   First MD Initiated Contact with Patient 11/30/14 [phone]     Chief Complaint  Patient presents with  . Eye Pain     (Consider location/radiation/quality/duration/timing/severity/associated sxs/prior Treatment) Patient is a 24 y.o. male presenting with eye pain. The history is provided by the patient. No language interpreter was used.  Eye Pain This is a new problem. The current episode started yesterday. The problem occurs constantly. The problem has not changed since onset.Pertinent negatives include no chest pain, no abdominal pain, no headaches and no shortness of breath. Exacerbated by: LIGHT. Nothing relieves the symptoms. He has tried nothing for the symptoms. The treatment provided no relief.    History reviewed. No pertinent past medical history. History reviewed. No pertinent past surgical history. No family history on file. History  Substance Use Topics  . Smoking status: Never Smoker   . Smokeless tobacco: Not on file  . Alcohol Use: 1.8 oz/week    3 Cans of beer per week    Review of Systems  Constitutional: Negative for fever, activity change, appetite change and fatigue.  HENT: Negative for congestion, facial swelling, rhinorrhea and trouble swallowing.   Eyes: Positive for photophobia, pain, redness and visual disturbance.  Respiratory: Negative for cough, chest tightness and shortness of breath.   Cardiovascular: Negative for chest pain and leg swelling.  Gastrointestinal: Negative for nausea, vomiting, abdominal pain, diarrhea and constipation.  Endocrine: Negative for polydipsia and polyuria.  Genitourinary: Negative for dysuria, urgency, decreased urine volume and difficulty urinating.  Musculoskeletal: Negative for back pain and gait problem.  Skin: Negative for color change, rash and wound.  Allergic/Immunologic: Negative for immunocompromised state.  Neurological: Negative for dizziness, facial  asymmetry, speech difficulty, weakness, numbness and headaches.  Psychiatric/Behavioral: Negative for confusion, decreased concentration and agitation.      Allergies  Review of patient's allergies indicates no known allergies.  Home Medications   Prior to Admission medications   Medication Sig Start Date End Date Taking? Authorizing Provider  ciprofloxacin (CILOXAN) 0.3 % ophthalmic solution Place 1 drop into both eyes every 4 (four) hours while awake. Administer 1 drop, every 1 hours, while awake, for 1 days. Then 1 drop, every 4 hours, while awake, for the next 5 days. 11/30/14   Toy Cookey, MD  ondansetron (ZOFRAN) 4 MG tablet Take 1 tablet (4 mg total) by mouth every 6 (six) hours. Patient not taking: Reported on 11/30/2014 06/07/14   Glynn Octave, MD   BP 123/69 mmHg  Pulse 61  Temp(Src) 97.8 F (36.6 C) (Oral)  Resp 16  Ht 5\' 11"  (1.803 m)  Wt 256 lb (116.121 kg)  BMI 35.72 kg/m2  SpO2 98% Physical Exam  Constitutional: He is oriented to person, place, and time. He appears well-developed and well-nourished. No distress.  HENT:  Head: Normocephalic and atraumatic.  Mouth/Throat: No oropharyngeal exudate.  Eyes: EOM are normal. Pupils are equal, round, and reactive to light. Right eye exhibits no chemosis, no discharge, no exudate and no hordeolum. No foreign body present in the right eye. Left eye exhibits no chemosis, no discharge, no exudate and no hordeolum. No foreign body present in the left eye. Right conjunctiva is injected. Left conjunctiva is injected. No scleral icterus.  Central shallow corneal ulcerations bilaterally, no dendritic ulcerations.  Negative Seidel sign.   Neck: Normal range of motion. Neck supple.  Cardiovascular: Normal rate, regular rhythm and normal heart sounds.  Exam reveals  no gallop and no friction rub.   No murmur heard. Pulmonary/Chest: Effort normal and breath sounds normal. No respiratory distress. He has no wheezes. He has no rales.   Abdominal: Soft. Bowel sounds are normal. He exhibits no distension and no mass. There is no tenderness. There is no rebound and no guarding.  Musculoskeletal: Normal range of motion. He exhibits no edema or tenderness.  Neurological: He is alert and oriented to person, place, and time.  Skin: Skin is warm and dry.  Psychiatric: He has a normal mood and affect.    ED Course  Procedures (including critical care time) Labs Review Labs Reviewed - No data to display  Imaging Review No results found.   EKG Interpretation None      MDM   Final diagnoses:  Corneal ulcer, central, bilateral    Pt is a 24 y.o. male with Pmhx as above who presents with bilateral blurry vision and foreign body station to both eyes with onset yesterday.  He has had clear tearing but no drainage.  No fevers.  No known injuries to the eyes, but states that he wears his contacts to work and there is a lot of dust at work.  He wears 2 week contacts, which he states he changes regularly.  On physical exam, vital signs are stable.  He is in no acute distress.  His bilateral conjunctival injection, and bilateral shallow central corneal ulcerations which are likely due to contact lens use.  Negative Seidel sign.  Patient will be placed on Cipro drops, asked to follow-up experience on a with his optometrist or with ophthalmology if unable to get an appointment.  Patient instructed not to wear contacts until cleared by an eye doctor.     Stephen Hansen evaluation in the Emergency Department is complete. It has been determined that no acute conditions requiring further emergency intervention are present at this time. The patient/guardian have been advised of the diagnosis and plan. We have discussed signs and symptoms that warrant return to the ED, such as changes or worsening in symptoms, worsening pain, fevers, worsening blurry vision.       Toy Cookey, MD 11/30/14 [phone]

## 2014-11-30 NOTE — Discharge Instructions (Signed)
Corneal Ulcer A corneal ulcer is an open sore on the cornea. The cornea is the clear covering at the front and center of the eye.  CAUSES  Most corneal ulcers are caused by infection, but there are other causes as well.  Bacterial infection. A bacterial infection can occur and cause a corneal ulcer if:  Contact lenses are worn too long (especially overnight) or are not properly cared for.  An eye injury occurs, allowing bacteria to infect the area of injury.  Viral infection. A viral infection can occur and cause a corneal ulcer if:  The eye becomes infected with a virus, such as the herpes simplex (cold sore) virus, chickenpox virus, or shingles virus.  Fungal infection. A fungal infection can occur and cause a corneal ulcer if:  An eye injury resulted from contact with a plant or plant material.  An anti-inflammatory eye drop is overused.  You have a weakened immune system.  Contact lenses are improperly cared for or become infected.  Foreign bodies in the eye, such as sand, glass, or small pieces of glass or metal.  Dry eyes.  Certain disorders that prevent eyelids from closing completely, such as Bell's palsy.  Contact lenses, especially extended-wear soft contact lenses. Contact lenses can:  Scratch the cornea's surface, allowing bacteria to enter the scratch.  Trap dirt underneath the contact lens, which can scratch the cornea.  Harbor bacteria and fungi, making it more likely for bacterial infections to occur.  Block oxygen from the cornea, making it more likely for infections to occur. SYMPTOMS   Eye pain that is often severe.  Blurry vision.  Light sensitivity.  Pus or thick discharge coming from your eye.  Eye redness.  Feeling like something is in your eye.  Watery or itchy eye.  Burning or stinging feeling. Some ulcers that are very big may be seen as a white spot on the cornea. DIAGNOSIS  An eye exam will be performed. Your health care provider  may use a special kind of microscope (slit lamp) to look at the cornea. Eye drops may be put into the eye to make the ulcer easier to see. If it is suspected that an infection caused the corneal ulcer, tissue samples or cultures from the eye may be taken. Numbing eye drops will be given before any samples or cultures are taken. The samples or cultures will be examined in the lab to check for bacteria, viruses, or fungi. TREATMENT  Treatment of the corneal ulcer depends on the cause. If your ulcer is severe, you may be given antibiotic eye drops up until your health care provider knows the test results. Other treatments can include:  Antibacterial, antiviral, or antifungal eye drops or ointment.  Removing the foreign body that caused the eye injury.  Artificial tears or a bandage contact lens if severe dry eyes caused the corneal ulcer.  Over-the-counter or prescription pain medicine.  Steroidal eye drops if the eye is inflamed and swollen.  Antibiotic medicines by mouth.  An injection of medicine under the thin membrane covering the eyeball (conjunctiva). This allows medicine to reach the ulcer in high doses.  Eye patching to reduce irritation from blinking and bright light. An eye patch may not be given if the ulcer was caused by a bacterial infection. If the corneal ulcer causes a scar on the cornea that interferes with vision, hospitalization and surgery may be needed to replace the cornea (corneal transplant). HOME CARE INSTRUCTIONS   If prescribed, use your antibiotic   pills, eye drops, or ointment as directed. Continue using them even if you start to feel better. You may have to apply eye drops as often as every few minutes to every hour, for days. It may be necessary to set your alarm clock every few minutes to every hour during the night. This is absolutely necessary.  Only take over-the-counter or prescription medicines as directed by your health care provider.  Apply artificial  tears as needed if you have dry eyes.  Do not touch or rub your eye, because this may increase the irritation and spread the infection.  Avoid wearing eye makeup.  Stay in a dark room and use sunglasses if you have light sensitivity.  Apply cool packs to your eye to relieve discomfort and swelling.  If your eye is patched, you should not drive or use machinery. You will have reduced side vision and ability to judge distance.  Do not drive or operate machinery until approved by your health care provider. Your ability to judge distances is impaired.  Follow up with your health care provider as directed.  Do not wear contact lenses until your health care provider approves. If you normally wear contact lenses, follow these general rules to avoid the risk of a corneal ulcer:  Do not wear contact lenses while you sleep.  Wash your hands before removing contact lenses.  Properly sterilize and store your contact lenses.  Regularly clean your contact lens case.  Do not use your saliva or tap water to clean or wet your contact lenses.  Remove your contact lenses if your eye becomes irritated. You may put them back in once your eyes feel better. SEEK IMMEDIATE MEDICAL CARE IF:   You notice a change in your vision.  Your pain is getting worse, not better.  You have increasing discharge from the eye. MAKE SURE YOU:   Understand these instructions.  Will watch your condition.  Will get help right away if you are not doing well or get worse. Document Released: 07/05/2004 Document Revised: 01/28/2013 Document Reviewed: 10/28/2012 [employer] Patient Information 2015 [employer]. This information is not intended to replace advice given to you by your health care provider. Make sure you discuss any questions you have with your health care provider.  

## 2014-11-30 NOTE — ED Notes (Signed)
Pt in from home c/o bil eye pain onset yesterday, denies injury or trauma, pt c/o stinging sensation in both eyes & blurred vision, A&O x4

## 2014-12-23 ENCOUNTER — Encounter (HOSPITAL_COMMUNITY): Payer: Self-pay | Admitting: Emergency Medicine

## 2014-12-23 ENCOUNTER — Emergency Department (HOSPITAL_COMMUNITY)
Admission: EM | Admit: 2014-12-23 | Discharge: 2014-12-23 | Disposition: A | Discharge status: Home / Self Care | Payer: Self-pay | Attending: Emergency Medicine | Admitting: Emergency Medicine

## 2014-12-23 DIAGNOSIS — H5789 Other specified disorders of eye and adnexa: Secondary | ICD-10-CM

## 2014-12-23 DIAGNOSIS — H578 Other specified disorders of eye and adnexa: Secondary | ICD-10-CM | POA: Insufficient documentation

## 2014-12-23 DIAGNOSIS — H539 Unspecified visual disturbance: Secondary | ICD-10-CM | POA: Insufficient documentation

## 2014-12-23 MED ORDER — FLUORESCEIN SODIUM 1 MG OP STRP
2.0000 | ORAL_STRIP | Freq: Once | OPHTHALMIC | Status: AC
  Administered 2014-12-23: 2 via OPHTHALMIC
  Filled 2014-12-23: qty 2

## 2014-12-23 MED ORDER — TETRACAINE HCL 0.5 % OP SOLN
2.0000 [drp] | Freq: Once | OPHTHALMIC | Status: AC
  Administered 2014-12-23: 2 [drp] via OPHTHALMIC
  Filled 2014-12-23: qty 2

## 2014-12-23 MED ORDER — CIPROFLOXACIN HCL 0.3 % OP SOLN: 1.0000 [drp] | OPHTHALMIC | Status: DC

## 2014-12-23 NOTE — ED Notes (Signed)
Patient states he was seen here 3 weeks ago for eye pain and was Dx with Eye ulcers. Patient states he was given antibiotics at discharge. Patient states it seem to be clearing up until about a week ago when he started having the same symptoms. Patient describes as " cloudy vision, burning and it feel like something in my eye. Patient gives pain 8/10 in right eye.

## 2014-12-23 NOTE — ED Notes (Signed)
Pts vision is 20/200 in both eyes

## 2014-12-23 NOTE — Discharge Instructions (Signed)
Please take your antibiotic eyedrops as prescribed. It is important for you to follow-up with Dr. Dione BoozeGroat and ophthalmology for definitive care. Return to ED for worsening symptoms.

## 2014-12-23 NOTE — ED Provider Notes (Signed)
CSN: [id number]     Arrival date & time 12/23/14  1229 History  This chart was scribed for Joycie Peek, PA-C, working with Toy Cookey, MD by Chestine Spore, ED Scribe. The patient was seen in room TR04C/TR04C at 1:19 PM.    Chief Complaint  Patient presents with  . Eye Pain     The history is provided by the patient. No language interpreter was used.    HPI Comments: Stephen Hansen is a 24 y.o. male who presents to the Emergency Department complaining of bilateral eye pain onset yesterday. Pt was seen in the ED 3 weeks ago for same symptoms and was dx with eye ulcers. Pt was given abx that temporarily cleared his symptoms until the same symptoms came back 1 week ago. Pt was using the abx drops as he was informed. Pt was given f/u with an eye doctor but didn't follow up. Pt reports that it feels like something is in both of his eyes. Pt reports that it feels like something is in both of his eyes. Pt reports that he is having associated symptoms of vision disturbance and eye irritation. Pt eye irritation is 6/10. Pt reports that OTC drops aid in the relief of his eye irritation. Reports he does wear contact lenses, has been wearing them, but is not wearing them today. Pt denies any medical issues and allergies to medications. No fevers, chills, headache, nausea or vomiting   History reviewed. No pertinent past medical history. History reviewed. No pertinent past surgical history. No family history on file. History  Substance Use Topics  . Smoking status: Never Smoker   . Smokeless tobacco: Not on file  . Alcohol Use: 1.8 oz/week    3 Cans of beer per week    Review of Systems  Eyes: Positive for visual disturbance.       Bilateral eye irritation  All other systems reviewed and are negative.     Allergies  Review of patient's allergies indicates no known allergies.  Home Medications   Prior to Admission medications   Medication Sig Start Date End Date Taking? Authorizing  Provider  ciprofloxacin (CILOXAN) 0.3 % ophthalmic solution Place 1 drop into both eyes every 4 (four) hours while awake. Administer 1 drop, every 1 hours, while awake, for 1 days. Then 1 drop, every 4 hours, while awake, for the next 5 days. 12/23/14   Joycie Peek, PA-C  ondansetron (ZOFRAN) 4 MG tablet Take 1 tablet (4 mg total) by mouth every 6 (six) hours. Patient not taking: Reported on 11/30/2014 06/07/14   Glynn Octave, MD   BP 135/69 mmHg  Pulse 68  Temp(Src) 98.4 F (36.9 C) (Oral)  Resp 22  Ht  (1.803 m)  Wt 250 lb (113.399 kg)  BMI 34.88 kg/m2  SpO2 99% Physical Exam  Constitutional: He is oriented to person, place, and time. He appears well-developed and well-nourished. No distress.  HENT:  Head: Normocephalic and atraumatic.  Eyes: EOM are normal. Pupils are equal, round, and reactive to light.  Slit lamp exam:      The right eye shows no corneal abrasion and no foreign body.       The left eye shows no corneal abrasion and no foreign body.  There is bilateral conjunctival injection. No consensual pain. No obvious foreign bodies. Slit lamp evaluation shows no obvious cells and flare. Woods lamp shows no corneal abrasions.  Neck: Neck supple. No tracheal deviation present.  Cardiovascular: Normal rate.   Pulmonary/Chest: Effort  normal. No respiratory distress.  Musculoskeletal: Normal range of motion.  Neurological: He is alert and oriented to person, place, and time.  Skin: Skin is warm and dry.  Psychiatric: He has a normal mood and affect. His behavior is normal.  Nursing note and vitals reviewed.   ED Course  Procedures (including critical care time) DIAGNOSTIC STUDIES: Oxygen Saturation is 99% on RA, nl by my interpretation.    COORDINATION OF CARE: 1:24 PM-Discussed treatment plan with pt at bedside and pt agreed to plan.   Labs Review Labs Reviewed - No data to display  Imaging Review No results found.   EKG Interpretation None      Meds given in ED:  Medications  fluorescein ophthalmic strip 2 strip (2 strips Both Eyes Given 12/23/14 1340)  tetracaine (PONTOCAINE) 0.5 % ophthalmic solution 2 drop (2 drops Right Eye Given 12/23/14 1415)    New Prescriptions   No medications on file   Filed Vitals:   12/23/14 1241  BP: 135/69  Pulse: 68  Temp: 98.4 F (36.9 C)  TempSrc: Oral  Resp: 22  Height: 5\' 11"  (1.803 m)  Weight: 250 lb (113.399 kg)  SpO2: 99%  \   MDM  Vitals stable - WNL -afebrile Pt resting comfortably in ED. PE--physical exam is not concerning for emergent iritis, glaucoma. No foreign bodies visualized. Unable to visualize any evidence of cells and flare. No consensual pain. Low concern for acute or emergent pathology. Patient discharged with Cipro eyedrops, given referral to ophthalmology and stress importance of follow-up. Also stressed discontinued use of contact lens use until seen by ophthalmology. Patient agrees to plan.  I discussed all relevant lab findings and imaging results with pt and they verbalized understanding. Discussed f/u with PCP within 48 hrs and return precautions, pt very amenable to plan.  Final diagnoses:  Irritation of both eyes   I personally performed the services described in this documentation, which was scribed in my presence. The recorded information has been reviewed and is accurate.    Joycie PeekBenjamin Vanessia Bokhari, PA-C 12/23/14 1442  Toy CookeyMegan Docherty, MD 12/25/14 1100

## 2015-07-14 ENCOUNTER — Ambulatory Visit (INDEPENDENT_AMBULATORY_CARE_PROVIDER_SITE_OTHER): Payer: Federal, State, Local not specified - PPO | Admitting: Physician Assistant

## 2015-07-14 VITALS — BP 110/80 | HR 67 | Temp 98.3°F | Resp 18 | Ht 71.0 in | Wt 273.4 lb

## 2015-07-14 DIAGNOSIS — Z23 Encounter for immunization: Secondary | ICD-10-CM | POA: Diagnosis not present

## 2015-07-14 DIAGNOSIS — M62830 Muscle spasm of back: Secondary | ICD-10-CM

## 2015-07-14 DIAGNOSIS — S39012A Strain of muscle, fascia and tendon of lower back, initial encounter: Secondary | ICD-10-CM | POA: Diagnosis not present

## 2015-07-14 MED ORDER — CYCLOBENZAPRINE HCL 10 MG PO TABS: 10.0000 mg | ORAL_TABLET | Freq: Three times a day (TID) | ORAL | Status: DC | PRN

## 2015-07-14 MED ORDER — MELOXICAM 15 MG PO TABS: 15.0000 mg | ORAL_TABLET | Freq: Every day | ORAL | Status: DC
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
## 2015-07-14 NOTE — Patient Instructions (Signed)
Please hydrate well with 64 oz of water. Please take the mobic daily.  Do not take ibuprofen or naproxen with this.  You can take tylenol.    Low Back Sprain With Rehab A sprain is an injury in which a ligament is torn. The ligaments of the lower back are vulnerable to sprains. However, they are strong and require great force to be injured. These ligaments are important for stabilizing the spinal column. Sprains are classified into three categories. Grade 1 sprains cause pain, but the tendon is not lengthened. Grade 2 sprains include a lengthened ligament, due to the ligament being stretched or partially ruptured. With grade 2 sprains there is still function, although the function may be decreased. Grade 3 sprains involve a complete tear of the tendon or muscle, and function is usually impaired. SYMPTOMS   Severe pain in the lower back.  Sometimes, a feeling of a "pop," "snap," or tear, at the time of injury.  Tenderness and sometimes swelling at the injury site.  Uncommonly, bruising (contusion) within 48 hours of injury.  Muscle spasms in the back. CAUSES  Low back sprains occur when a force is placed on the ligaments that is greater than they can handle. Common causes of injury include:  Performing a stressful act while off-balance.  Repetitive stressful activities that involve movement of the lower back.  Direct hit (trauma) to the lower back. RISK INCREASES WITH:  Contact sports (football, wrestling).  Collisions (major skiing accidents).  Sports that require throwing or lifting (baseball, weightlifting).  Sports involving twisting of the spine (gymnastics, diving, tennis, golf).  Poor strength and flexibility.  Inadequate protection.  Previous back injury or surgery (especially fusion). PREVENTION  Wear properly fitted and padded protective equipment.  Warm up and stretch properly before activity.  Allow for adequate recovery between workouts.  Maintain physical  fitness:  Strength, flexibility, and endurance.  Cardiovascular fitness.  Maintain a healthy body weight. PROGNOSIS  If treated properly, low back sprains usually heal with non-surgical treatment. The length of time for healing depends on the severity of the injury.  RELATED COMPLICATIONS   Recurring symptoms, resulting in a chronic problem.  Chronic inflammation and pain in the low back.  Delayed healing or resolution of symptoms, especially if activity is resumed too soon.  Prolonged impairment.  Unstable or arthritic joints of the low back. TREATMENT  Treatment first involves the use of ice and medicine, to reduce pain and inflammation. The use of strengthening and stretching exercises may help reduce pain with activity. These exercises may be performed at home or with a therapist. Severe injuries may require referral to a therapist for further evaluation and treatment, such as ultrasound. Your caregiver may advise that you wear a back brace or corset, to help reduce pain and discomfort. Often, prolonged bed rest results in greater harm then benefit. Corticosteroid injections may be recommended. However, these should be reserved for the most serious cases. It is important to avoid using your back when lifting objects. At night, sleep on your back on a firm mattress, with a pillow placed under your knees. If non-surgical treatment is unsuccessful, surgery may be needed.  MEDICATION   If pain medicine is needed, nonsteroidal anti-inflammatory medicines (aspirin and ibuprofen), or other minor pain relievers (acetaminophen), are often advised.  Do not take pain medicine for 7 days before surgery.  Prescription pain relievers may be given, if your caregiver thinks they are needed. Use only as directed and only as much as you  need.  Ointments applied to the skin may be helpful.  Corticosteroid injections may be given by your caregiver. These injections should be reserved for the most  serious cases, because they may only be given a certain number of times. HEAT AND COLD  Cold treatment (icing) should be applied for 10 to 15 minutes every 2 to 3 hours for inflammation and pain, and immediately after activity that aggravates your symptoms. Use ice packs or an ice massage.  Heat treatment may be used before performing stretching and strengthening activities prescribed by your caregiver, physical therapist, or athletic trainer. Use a heat pack or a warm water soak. SEEK MEDICAL CARE IF:   Symptoms get worse or do not improve in 2 to 4 weeks, despite treatment.  You develop numbness or weakness in either leg.  You lose bowel or bladder function.  Any of the following occur after surgery: fever, increased pain, swelling, redness, drainage of fluids, or bleeding in the affected area.  New, unexplained symptoms develop. (Drugs used in treatment may produce side effects.) EXERCISES  RANGE OF MOTION (ROM) AND STRETCHING EXERCISES - Low Back Sprain Most people with lower back pain will find that their symptoms get worse with excessive bending forward (flexion) or arching at the lower back (extension). The exercises that will help resolve your symptoms will focus on the opposite motion.  Your physician, physical therapist or athletic trainer will help you determine which exercises will be most helpful to resolve your lower back pain. Do not complete any exercises without first consulting with your caregiver. Discontinue any exercises which make your symptoms worse, until you speak to your caregiver. If you have pain, numbness or tingling which travels down into your buttocks, leg or foot, the goal of the therapy is for these symptoms to move closer to your back and eventually resolve. Sometimes, these leg symptoms will get better, but your lower back pain may worsen. This is often an indication of progress in your rehabilitation. Be very alert to any changes in your symptoms and the  activities in which you participated in the 24 hours prior to the change. Sharing this information with your caregiver will allow him or her to most efficiently treat your condition. These exercises may help you when beginning to rehabilitate your injury. Your symptoms may resolve with or without further involvement from your physician, physical therapist or athletic trainer. While completing these exercises, remember:   Restoring tissue flexibility helps normal motion to return to the joints. This allows healthier, less painful movement and activity.  An effective stretch should be held for at least 30 seconds.  A stretch should never be painful. You should only feel a gentle lengthening or release in the stretched tissue. FLEXION RANGE OF MOTION AND STRETCHING EXERCISES: STRETCH - Flexion, Single Knee to Chest   Lie on a firm bed or floor with both legs extended in front of you.  Keeping one leg in contact with the floor, bring your opposite knee to your chest. Hold your leg in place by either grabbing behind your thigh or at your knee.  Pull until you feel a gentle stretch in your low back. Hold __________ seconds.  Slowly release your grasp and repeat the exercise with the opposite side. Repeat __________ times. Complete this exercise __________ times per day.  STRETCH - Flexion, Double Knee to Chest  Lie on a firm bed or floor with both legs extended in front of you.  Keeping one leg in contact with the  floor, bring your opposite knee to your chest.  Tense your stomach muscles to support your back and then lift your other knee to your chest. Hold your legs in place by either grabbing behind your thighs or at your knees.  Pull both knees toward your chest until you feel a gentle stretch in your low back. Hold __________ seconds.  Tense your stomach muscles and slowly return one leg at a time to the floor. Repeat __________ times. Complete this exercise __________ times per day.    STRETCH - Low Trunk Rotation  Lie on a firm bed or floor. Keeping your legs in front of you, bend your knees so they are both pointed toward the ceiling and your feet are flat on the floor.  Extend your arms out to the side. This will stabilize your upper body by keeping your shoulders in contact with the floor.  Gently and slowly drop both knees together to one side until you feel a gentle stretch in your low back. Hold for __________ seconds.  Tense your stomach muscles to support your lower back as you bring your knees back to the starting position. Repeat the exercise to the other side. Repeat __________ times. Complete this exercise __________ times per day  EXTENSION RANGE OF MOTION AND FLEXIBILITY EXERCISES: STRETCH - Extension, Prone on Elbows   Lie on your stomach on the floor, a bed will be too soft. Place your palms about shoulder width apart and at the height of your head.  Place your elbows under your shoulders. If this is too painful, stack pillows under your chest.  Allow your body to relax so that your hips drop lower and make contact more completely with the floor.  Hold this position for __________ seconds.  Slowly return to lying flat on the floor. Repeat __________ times. Complete this exercise __________ times per day.  RANGE OF MOTION - Extension, Prone Press Ups  Lie on your stomach on the floor, a bed will be too soft. Place your palms about shoulder width apart and at the height of your head.  Keeping your back as relaxed as possible, slowly straighten your elbows while keeping your hips on the floor. You may adjust the placement of your hands to maximize your comfort. As you gain motion, your hands will come more underneath your shoulders.  Hold this position __________ seconds.  Slowly return to lying flat on the floor. Repeat __________ times. Complete this exercise __________ times per day.  RANGE OF MOTION- Quadruped, Neutral Spine   Assume a hands  and knees position on a firm surface. Keep your hands under your shoulders and your knees under your hips. You may place padding under your knees for comfort.  Drop your head and point your tailbone toward the ground below you. This will round out your lower back like an angry cat. Hold this position for __________ seconds.  Slowly lift your head and release your tail bone so that your back sags into a large arch, like an old horse.  Hold this position for __________ seconds.  Repeat this until you feel limber in your low back.  Now, find your "sweet spot." This will be the most comfortable position somewhere between the two previous positions. This is your neutral spine. Once you have found this position, tense your stomach muscles to support your low back.  Hold this position for __________ seconds. Repeat __________ times. Complete this exercise __________ times per day.  STRENGTHENING EXERCISES - Low Back Sprain  These exercises may help you when beginning to rehabilitate your injury. These exercises should be done near your "sweet spot." This is the neutral, low-back arch, somewhere between fully rounded and fully arched, that is your least painful position. When performed in this safe range of motion, these exercises can be used for people who have either a flexion or extension based injury. These exercises may resolve your symptoms with or without further involvement from your physician, physical therapist or athletic trainer. While completing these exercises, remember:   Muscles can gain both the endurance and the strength needed for everyday activities through controlled exercises.  Complete these exercises as instructed by your physician, physical therapist or athletic trainer. Increase the resistance and repetitions only as guided.  You may experience muscle soreness or fatigue, but the pain or discomfort you are trying to eliminate should never worsen during these exercises. If this  pain does worsen, stop and make certain you are following the directions exactly. If the pain is still present after adjustments, discontinue the exercise until you can discuss the trouble with your caregiver. STRENGTHENING - Deep Abdominals, Pelvic Tilt   Lie on a firm bed or floor. Keeping your legs in front of you, bend your knees so they are both pointed toward the ceiling and your feet are flat on the floor.  Tense your lower abdominal muscles to press your low back into the floor. This motion will rotate your pelvis so that your tail bone is scooping upwards rather than pointing at your feet or into the floor. With a gentle tension and even breathing, hold this position for __________ seconds. Repeat __________ times. Complete this exercise __________ times per day.  STRENGTHENING - Abdominals, Crunches   Lie on a firm bed or floor. Keeping your legs in front of you, bend your knees so they are both pointed toward the ceiling and your feet are flat on the floor. Cross your arms over your chest.  Slightly tip your chin down without bending your neck.  Tense your abdominals and slowly lift your trunk high enough to just clear your shoulder blades. Lifting higher can put excessive stress on the lower back and does not further strengthen your abdominal muscles.  Control your return to the starting position. Repeat __________ times. Complete this exercise __________ times per day.  STRENGTHENING - Quadruped, Opposite UE/LE Lift   Assume a hands and knees position on a firm surface. Keep your hands under your shoulders and your knees under your hips. You may place padding under your knees for comfort.  Find your neutral spine and gently tense your abdominal muscles so that you can maintain this position. Your shoulders and hips should form a rectangle that is parallel with the floor and is not twisted.  Keeping your trunk steady, lift your right hand no higher than your shoulder and then your  left leg no higher than your hip. Make sure you are not holding your breath. Hold this position for __________ seconds.  Continuing to keep your abdominal muscles tense and your back steady, slowly return to your starting position. Repeat with the opposite arm and leg. Repeat __________ times. Complete this exercise __________ times per day.  STRENGTHENING - Abdominals and Quadriceps, Straight Leg Raise   Lie on a firm bed or floor with both legs extended in front of you.  Keeping one leg in contact with the floor, bend the other knee so that your foot can rest flat on the floor.  Find your  neutral spine, and tense your abdominal muscles to maintain your spinal position throughout the exercise.  Slowly lift your straight leg off the floor about 6 inches for a count of 15, making sure to not hold your breath.  Still keeping your neutral spine, slowly lower your leg all the way to the floor. Repeat this exercise with each leg __________ times. Complete this exercise __________ times per day. POSTURE AND BODY MECHANICS CONSIDERATIONS - Low Back Sprain Keeping correct posture when sitting, standing or completing your activities will reduce the stress put on different body tissues, allowing injured tissues a chance to heal and limiting painful experiences. The following are general guidelines for improved posture. Your physician or physical therapist will provide you with any instructions specific to your needs. While reading these guidelines, remember:  The exercises prescribed by your provider will help you have the flexibility and strength to maintain correct postures.  The correct posture provides the best environment for your joints to work. All of your joints have less wear and tear when properly supported by a spine with good posture. This means you will experience a healthier, less painful body.  Correct posture must be practiced with all of your activities, especially prolonged sitting and  standing. Correct posture is as important when doing repetitive low-stress activities (typing) as it is when doing a single heavy-load activity (lifting). RESTING POSITIONS Consider which positions are most painful for you when choosing a resting position. If you have pain with flexion-based activities (sitting, bending, stooping, squatting), choose a position that allows you to rest in a less flexed posture. You would want to avoid curling into a fetal position on your side. If your pain worsens with extension-based activities (prolonged standing, working overhead), avoid resting in an extended position such as sleeping on your stomach. Most people will find more comfort when they rest with their spine in a more neutral position, neither too rounded nor too arched. Lying on a non-sagging bed on your side with a pillow between your knees, or on your back with a pillow under your knees will often provide some relief. Keep in mind, being in any one position for a prolonged period of time, no matter how correct your posture, can still lead to stiffness. PROPER SITTING POSTURE In order to minimize stress and discomfort on your spine, you must sit with correct posture. Sitting with good posture should be effortless for a healthy body. Returning to good posture is a gradual process. Many people can work toward this most comfortably by using various supports until they have the flexibility and strength to maintain this posture on their own. When sitting with proper posture, your ears will fall over your shoulders and your shoulders will fall over your hips. You should use the back of the chair to support your upper back. Your lower back will be in a neutral position, just slightly arched. You may place a small pillow or folded towel at the base of your lower back for  support.  When working at a desk, create an environment that supports good, upright posture. Without extra support, muscles tire, which leads to  excessive strain on joints and other tissues. Keep these recommendations in mind: CHAIR:  A chair should be able to slide under your desk when your back makes contact with the back of the chair. This allows you to work closely.  The chair's height should allow your eyes to be level with the upper part of your monitor and your hands  to be slightly lower than your elbows. BODY POSITION  Your feet should make contact with the floor. If this is not possible, use a foot rest.  Keep your ears over your shoulders. This will reduce stress on your neck and low back. INCORRECT SITTING POSTURES  If you are feeling tired and unable to assume a healthy sitting posture, do not slouch or slump. This puts excessive strain on your back tissues, causing more damage and pain. Healthier options include:  Using more support, like a lumbar pillow.  Switching tasks to something that requires you to be upright or walking.  Talking a brief walk.  Lying down to rest in a neutral-spine position. PROLONGED STANDING WHILE SLIGHTLY LEANING FORWARD  When completing a task that requires you to lean forward while standing in one place for a long time, place either foot up on a stationary 2-4 inch high object to help maintain the best posture. When both feet are on the ground, the lower back tends to lose its slight inward curve. If this curve flattens (or becomes too large), then the back and your other joints will experience too much stress, tire more quickly, and can cause pain. CORRECT STANDING POSTURES Proper standing posture should be assumed with all daily activities, even if they only take a few moments, like when brushing your teeth. As in sitting, your ears should fall over your shoulders and your shoulders should fall over your hips. You should keep a slight tension in your abdominal muscles to brace your spine. Your tailbone should point down to the ground, not behind your body, resulting in an over-extended  swayback posture.  INCORRECT STANDING POSTURES  Common incorrect standing postures include a forward head, locked knees and/or an excessive swayback. WALKING Walk with an upright posture. Your ears, shoulders and hips should all line-up. PROLONGED ACTIVITY IN A FLEXED POSITION When completing a task that requires you to bend forward at your waist or lean over a low surface, try to find a way to stabilize 3 out of 4 of your limbs. You can place a hand or elbow on your thigh or rest a knee on the surface you are reaching across. This will provide you more stability, so that your muscles do not tire as quickly. By keeping your knees relaxed, or slightly bent, you will also reduce stress across your lower back. CORRECT LIFTING TECHNIQUES DO :  Assume a wide stance. This will provide you more stability and the opportunity to get as close as possible to the object which you are lifting.  Tense your abdominals to brace your spine. Bend at the knees and hips. Keeping your back locked in a neutral-spine position, lift using your leg muscles. Lift with your legs, keeping your back straight.  Test the weight of unknown objects before attempting to lift them.  Try to keep your elbows locked down at your sides in order get the best strength from your shoulders when carrying an object.  Always ask for help when lifting heavy or awkward objects. INCORRECT LIFTING TECHNIQUES DO NOT:   Lock your knees when lifting, even if it is a small object.  Bend and twist. Pivot at your feet or move your feet when needing to change directions.  Assume that you can safely pick up even a paperclip without proper posture.   This information is not intended to replace advice given to you by your health care provider. Make sure you discuss any questions you have with your health care  provider.   Document Released: 05/28/2005 Document Revised: 06/18/2014 Document Reviewed: 09/09/2008 [employer] Interactive Patient  Education [employer]

## 2015-09-10 ENCOUNTER — Ambulatory Visit (INDEPENDENT_AMBULATORY_CARE_PROVIDER_SITE_OTHER): Payer: Federal, State, Local not specified - PPO | Admitting: Physician Assistant

## 2015-09-10 VITALS — BP 122/84 | HR 72 | Temp 98.6°F | Resp 16 | Ht 71.0 in | Wt 280.0 lb

## 2015-09-10 DIAGNOSIS — R300 Dysuria: Secondary | ICD-10-CM

## 2015-09-10 LAB — POCT URINALYSIS DIP (MANUAL ENTRY)
BILIRUBIN UA: NEGATIVE
BILIRUBIN UA: NEGATIVE
Blood, UA: NEGATIVE
GLUCOSE UA: NEGATIVE
Leukocytes, UA: NEGATIVE
NITRITE UA: NEGATIVE
PH UA: 5
Protein Ur, POC: NEGATIVE
Spec Grav, UA: 1.025
Urobilinogen, UA: 0.2

## 2015-09-10 LAB — POC MICROSCOPIC URINALYSIS (UMFC): Mucus: ABSENT

## 2015-09-10 MED ORDER — SULFAMETHOXAZOLE-TRIMETHOPRIM 800-160 MG PO TABS
1.0000 | ORAL_TABLET | Freq: Two times a day (BID) | ORAL | Status: DC
Start: 2015-09-10 — End: 2016-06-20

## 2015-09-10 NOTE — Progress Notes (Signed)
[facility] [address], [place] [id number] [phone]  Date:  09/10/2015   Name:  Stephen LippsJonathan Hansen   DOB:  Jan 25, 1991   MRN:  [id number]  PCP:  No PCP Per Patient    History of Present Illness:  Stephen LippsJonathan Gammel is a 25 y.o. male patient [facility]     There are no active problems to display for this patient.   Past Medical History  Diagnosis Date  . Allergy     No past surgical history on file.  Social History  Substance Use Topics  . Smoking status: Never Smoker   . Smokeless tobacco: None  . Alcohol Use: 1.8 oz/week    3 Cans of beer per week    Family History  Problem Relation Age of Onset  . Hyperlipidemia Mother   . Hypertension Mother   . Diabetes Maternal Grandmother   . Heart disease Maternal Grandmother   . Hyperlipidemia Maternal Grandmother   . Hypertension Maternal Grandmother   . Cancer Maternal Grandfather   . Hyperlipidemia Maternal Grandfather   . Mental illness Maternal Grandfather   . Diabetes Paternal Grandmother   . Hyperlipidemia Paternal Grandmother   . Cancer Paternal Grandfather   . Hyperlipidemia Paternal Grandfather     No Known Allergies  Medication list has been reviewed and updated.  Current Outpatient Prescriptions on File Prior to Visit  Medication Sig Dispense Refill  . ciprofloxacin (CILOXAN) 0.3 % ophthalmic solution Place 1 drop into both eyes every 4 (four) hours while awake. Administer 1 drop, every 1 hours, while awake, for 1 days. Then 1 drop, every 4 hours, while awake, for the next 5 days. (Patient not taking: Reported on 07/14/2015) 5 mL 0  . cyclobenzaprine (FLEXERIL) 10 MG tablet Take 1 tablet (10 mg total) by mouth 3 (three) times daily as needed for muscle spasms. (Patient not taking: Reported on 09/10/2015) 30 tablet 0  . meloxicam (MOBIC) 15 MG tablet Take 1 tablet (15 mg total) by mouth daily. (Patient not taking: Reported on 09/10/2015) 30 tablet 1   No current  facility-administered medications on file prior to visit.    ROS   Physical Examination: BP 122/84 mmHg  Pulse 72  Temp(Src) 98.6 F (37 C) (Oral)  Resp 16  Ht 5\' 11"  (1.803 m)  Wt 280 lb (127.007 kg)  BMI 39.07 kg/m2  SpO2 97% Ideal Body Weight: Weight in (lb) to have BMI = 25: 178.9  Physical Exam  Constitutional: He is oriented to person, place, and time. He appears well-developed and well-nourished. No distress.  HENT:  Head: Normocephalic and atraumatic.  Eyes: Conjunctivae and EOM are normal. Pupils are equal, round, and reactive to light.  Cardiovascular: Normal rate.   Pulmonary/Chest: Effort normal. No respiratory distress.  Abdominal: Soft. Normal appearance and bowel sounds are normal. There is tenderness. There is CVA tenderness (left sided flank pain with palpation.). There is no tenderness at McBurney's point and negative Murphy's sign.  Genitourinary: Testes normal and penis normal. Right testis shows no mass. Left testis shows no mass.  No erythema or swelling at the urethra.  Neurological: He is alert and oriented to person, place, and time.  Skin: Skin is warm and dry. He is not diaphoretic.  Psychiatric: He has a normal mood and affect. His behavior is normal.     Assessment and Plan: Stephen LippsJonathan Faust is a 25 y.o. male who is here today for dysuria. This appears to be infection-like.  I am placing urine culture and a gonorrhea chlamydia at this time.  I have advised heavy hydration, ibuprofen or tylenol use. We will discontinue the bactrim if normal.    Dysuria - Plan: POCT urinalysis dipstick, POCT Microscopic Urinalysis ([facility]), GC/Chlamydia Probe Amp, sulfamethoxazole-trimethoprim (BACTRIM DS,SEPTRA DS) 800-160 MG tablet, Urine culture  Trena Platt, PA-C [facility] 09/10/2015 4:25 PM

## 2015-09-10 NOTE — Patient Instructions (Signed)
Please take tylenol or ibuprofen for your pain. Please hydrate as much as possible.   Please take antibiotic as prescribed.  I will contact you with your lab results.   Pyelonephritis, Adult Pyelonephritis is a kidney infection. The kidneys are the organs that filter a person's blood and move waste out of the bloodstream and into the urine. Urine passes from the kidneys, through the ureters, and into the bladder. There are two main types of pyelonephritis:  Infections that come on quickly without any warning (acute pyelonephritis).  Infections that last for a long period of time (chronic pyelonephritis). In most cases, the infection clears up with treatment and does not cause further problems. More severe infections or chronic infections can sometimes spread to the bloodstream or lead to other problems with the kidneys. CAUSES This condition is usually caused by:  Bacteria traveling from the bladder to the kidney through infected urine. The urine in the bladder can become infected with bacteria from:  Bladder infection (cystitis).  Inflammation of the prostate gland (prostatitis).  Sexual intercourse, in females.  Bacteria traveling from the bloodstream to the kidney. RISK FACTORS This condition is more likely to develop in:  Pregnant women.  Older people.  People who have diabetes.  People who have kidney stones or bladder stones.  People who have other abnormalities of the kidney or ureter.  People who have a catheter placed in the bladder.  People who have cancer.  People who are sexually active.  Women who use spermicides.  People who have had a prior urinary tract infection. SYMPTOMS Symptoms of this condition include:  Frequent urination.  Strong or persistent urge to urinate.  Burning or stinging when urinating.  Abdominal pain.  Back pain.  Pain in the side or flank area.  Fever.  Chills.  Blood in the urine, or dark  urine.  Nausea.  Vomiting. DIAGNOSIS This condition may be diagnosed based on:  Medical history and physical exam.  Urine tests.  Blood tests. You may also have imaging tests of the kidneys, such as an ultrasound or CT scan. TREATMENT Treatment for this condition may depend on the severity of the infection.  If the infection is mild and is found early, you may be treated with antibiotic medicines taken by mouth. You will need to drink fluids to remain hydrated.  If the infection is more severe, you may need to stay in the hospital and receive antibiotics given directly into a vein through an IV tube. You may also need to receive fluids through an IV tube if you are not able to remain hydrated. After your hospital stay, you may need to take oral antibiotics for a period of time. Other treatments may be required, depending on the cause of the infection. HOME CARE INSTRUCTIONS Medicines  Take over-the-counter and prescription medicines only as told by your health care provider.  If you were prescribed an antibiotic medicine, take it as told by your health care provider. Do not stop taking the antibiotic even if you start to feel better. General Instructions  Drink enough fluid to keep your urine clear or pale yellow.  Avoid caffeine, tea, and carbonated beverages. They tend to irritate the bladder.  Urinate often. Avoid holding in urine for long periods of time.  Urinate before and after sex.  After a bowel movement, women should cleanse from front to back. Use each tissue only once.  Keep all follow-up visits as told by your health care provider. This is important. SEEK  MEDICAL CARE IF:  Your symptoms do not get better after 2 days of treatment.  Your symptoms get worse.  You have a fever. SEEK IMMEDIATE MEDICAL CARE IF:  You are unable to take your antibiotics or fluids.  You have shaking chills.  You vomit.  You have severe flank or back pain.  You have  extreme weakness or fainting.   This information is not intended to replace advice given to you by your health care provider. Make sure you discuss any questions you have with your health care provider.   Document Released: 05/28/2005 Document Revised: 02/16/2015 Document Reviewed: 09/20/2014 Elsevier Interactive Patient Education [employer]2016 Elsevier Inc.

## 2015-09-11 LAB — URINE CULTURE
COLONY COUNT: NO GROWTH
Organism ID, Bacteria: NO GROWTH

## 2015-09-13 LAB — GC/CHLAMYDIA PROBE AMP
CT PROBE, AMP APTIMA: NOT DETECTED
GC Probe RNA: NOT DETECTED

## 2015-10-11 ENCOUNTER — Encounter (HOSPITAL_COMMUNITY): Payer: Self-pay | Admitting: Emergency Medicine

## 2015-10-11 ENCOUNTER — Emergency Department (HOSPITAL_COMMUNITY)
Admission: EM | Admit: 2015-10-11 | Discharge: 2015-10-11 | Disposition: A | Discharge status: Home / Self Care | Payer: Federal, State, Local not specified - PPO | Attending: Emergency Medicine | Admitting: Emergency Medicine

## 2015-10-11 DIAGNOSIS — H5710 Ocular pain, unspecified eye: Secondary | ICD-10-CM | POA: Diagnosis present

## 2015-10-11 DIAGNOSIS — Z792 Long term (current) use of antibiotics: Secondary | ICD-10-CM | POA: Insufficient documentation

## 2015-10-11 DIAGNOSIS — H109 Unspecified conjunctivitis: Secondary | ICD-10-CM | POA: Diagnosis not present

## 2015-10-11 MED ORDER — POLYMYXIN B-TRIMETHOPRIM 10000-0.1 UNIT/ML-% OP SOLN: 1.0000 [drp] | OPHTHALMIC | Status: DC

## 2015-10-11 NOTE — Discharge Instructions (Signed)

## 2015-10-11 NOTE — ED Provider Notes (Signed)
CSN: [id number]     Arrival date & time 10/11/15  1715 History  By signing my name below, I, Soijett Blue, attest that this documentation has been prepared under the direction and in the presence of Gaylyn Rong, PA-C Electronically Signed: Soijett Blue, ED Scribe. 10/11/2015. 6:21 PM.   Chief Complaint  Patient presents with  . Eye Problem      The history is provided by the patient. No language interpreter was used.   HPI Comments: Stephen Hansen is a 25 y.o. male with a medical hx of seasonal allergies who presents to the Emergency Department complaining of left eye problem onset this morning. Pt reports that he has an eye infection to his left eye. He states that he is having associated symptoms of eye redness, clear eye drainage, eye pain, and blurred vision. He states that he has not tried any medications or eye drops for the relief for his symptoms. He denies any other symptoms.   Past Medical History  Diagnosis Date  . Allergy    History reviewed. No pertinent past surgical history. Family History  Problem Relation Age of Onset  . Hyperlipidemia Mother   . Hypertension Mother   . Diabetes Maternal Grandmother   . Heart disease Maternal Grandmother   . Hyperlipidemia Maternal Grandmother   . Hypertension Maternal Grandmother   . Cancer Maternal Grandfather   . Hyperlipidemia Maternal Grandfather   . Mental illness Maternal Grandfather   . Diabetes Paternal Grandmother   . Hyperlipidemia Paternal Grandmother   . Cancer Paternal Grandfather   . Hyperlipidemia Paternal Grandfather    Social History  Substance Use Topics  . Smoking status: Never Smoker   . Smokeless tobacco: None  . Alcohol Use: 1.8 oz/week    3 Cans of beer per week    Review of Systems  Constitutional: Negative for fever.  Eyes: Positive for pain, discharge, redness and visual disturbance.  All other systems reviewed and are negative.    Allergies  Review of patient's allergies indicates  no known allergies.  Home Medications   Prior to Admission medications   Medication Sig Start Date End Date Taking? Authorizing Provider  ciprofloxacin (CILOXAN) 0.3 % ophthalmic solution Place 1 drop into both eyes every 4 (four) hours while awake. Administer 1 drop, every 1 hours, while awake, for 1 days. Then 1 drop, every 4 hours, while awake, for the next 5 days. Patient not taking: Reported on 07/14/2015 12/23/14   Joycie Peek, PA-C  cyclobenzaprine (FLEXERIL) 10 MG tablet Take 1 tablet (10 mg total) by mouth 3 (three) times daily as needed for muscle spasms. Patient not taking: Reported on 09/10/2015 07/14/15   Collie Siad English, PA  meloxicam (MOBIC) 15 MG tablet Take 1 tablet (15 mg total) by mouth daily. Patient not taking: Reported on 09/10/2015 07/14/15   Collie Siad English, PA  sulfamethoxazole-trimethoprim (BACTRIM DS,SEPTRA DS) 800-160 MG tablet Take 1 tablet by mouth 2 (two) times daily. 09/10/15   Collie Siad English, PA   BP 138/83 mmHg  Pulse 63  Temp(Src) 98.1 F (36.7 C) (Oral)  Resp 18  Ht 6' (1.829 m)  Wt 270 lb (122.471 kg)  BMI 36.61 kg/m2  SpO2 97% Physical Exam  Constitutional: He is oriented to person, place, and time. He appears well-developed and well-nourished. No distress.  HENT:  Head: Normocephalic and atraumatic.  Eyes: Right eye exhibits no discharge. Left eye exhibits no discharge. Left conjunctiva is injected. No scleral icterus.  Slit lamp exam:  The left eye shows no corneal abrasion.  Left eye conjunctiva injected with clear drainage. No ptosis or erythema surrounding the eye.   Cardiovascular: Normal rate.   Pulmonary/Chest: Effort normal.  Neurological: He is alert and oriented to person, place, and time. Coordination normal.  Skin: Skin is warm and dry. No rash noted. He is not diaphoretic. No erythema. No pallor.  Psychiatric: He has a normal mood and affect. His behavior is normal.  Nursing note and vitals reviewed.   ED Course   Procedures (including critical care time) DIAGNOSTIC STUDIES: Oxygen Saturation is 97% on RA, nl by my interpretation.    COORDINATION OF CARE: 6:20 PM Discussed treatment plan with pt at bedside which includes abx Rx eye drops and pt agreed to plan.    Labs Review Labs Reviewed - No data to display  Imaging Review No results found.    EKG Interpretation None      MDM   Final diagnoses:  Conjunctivitis of left eye    Patient presentation consistent with conjunctivitis.  No evidence of corneal abrasions, entrapment, consensual photophobia, or herpes keratitis.  Presentation not concerning for iritis, or corneal abrasions.  Pt discharged with polytrim Rx.  Personal hygiene and frequent handwashing discussed.  Patient advised to follow up with ophthalmologist if symptoms persist or worsen. Return precautions discussed.  Patient verbalizes understanding and is agreeable with discharge.   I personally performed the services described in this documentation, which was scribed in my presence. The recorded information has been reviewed and is accurate.     Lester KinsmanSamantha Tripp Lecom[place]Dowless, PA-C 10/11/15 1827  Richardean Canalavid H Yao, MD 10/11/15 2230

## 2015-10-11 NOTE — ED Notes (Signed)
Pt states that he has had L eye swelling and redness since this morning. Clear drainage. Alert and oriented.

## 2016-01-11 ENCOUNTER — Emergency Department (HOSPITAL_COMMUNITY)
Admission: EM | Admit: 2016-01-11 | Discharge: 2016-01-11 | Disposition: A | Discharge status: Home / Self Care | Payer: Federal, State, Local not specified - PPO | Attending: Emergency Medicine | Admitting: Emergency Medicine

## 2016-01-11 ENCOUNTER — Encounter (HOSPITAL_COMMUNITY): Payer: Self-pay | Admitting: Emergency Medicine

## 2016-01-11 DIAGNOSIS — Z791 Long term (current) use of non-steroidal anti-inflammatories (NSAID): Secondary | ICD-10-CM | POA: Diagnosis not present

## 2016-01-11 DIAGNOSIS — R21 Rash and other nonspecific skin eruption: Secondary | ICD-10-CM

## 2016-01-11 DIAGNOSIS — L739 Follicular disorder, unspecified: Secondary | ICD-10-CM | POA: Diagnosis not present

## 2016-01-11 MED ORDER — CEPHALEXIN 500 MG PO CAPS: 500.0000 mg | ORAL_CAPSULE | Freq: Three times a day (TID) | ORAL | 0 refills | Status: DC

## 2016-01-11 MED ORDER — CEFTRIAXONE SODIUM 250 MG IJ SOLR
250.0000 mg | Freq: Once | INTRAMUSCULAR | Status: AC
Start: 2016-01-11 — End: 2016-01-11
  Administered 2016-01-11: 250 mg via INTRAMUSCULAR
  Filled 2016-01-11: qty 250

## 2016-01-11 MED ORDER — AZITHROMYCIN 250 MG PO TABS
1000.0000 mg | ORAL_TABLET | Freq: Once | ORAL | Status: AC
  Administered 2016-01-11: 1000 mg via ORAL
  Filled 2016-01-11: qty 4

## 2016-01-11 MED ORDER — VALACYCLOVIR HCL 1 G PO TABS: 1000.0000 mg | ORAL_TABLET | Freq: Two times a day (BID) | ORAL | 0 refills | Status: DC

## 2016-01-11 MED ORDER — STERILE WATER FOR INJECTION IJ SOLN
INTRAMUSCULAR | Status: AC
  Administered 2016-01-11: 10 mL
  Filled 2016-01-11: qty 10

## 2016-01-11 NOTE — ED Provider Notes (Signed)
[facility]-EMERGENCY DEPT Provider Note   CSN: [id number] Arrival date & time: 01/11/16  4098  First Provider Contact:  None       History   Chief Complaint Chief Complaint  Patient presents with  . Rash    HPI Stephen Hansen is a 25 y.o. male.  HPI 25 year old male with no significant past medical history presents with a several-day history of initially pruritic and now mildly painful penile rash. The patient states his symptoms started as small, blisters that were not painful, but were pruritic and "irritated" several days ago. He is sexually active but denies any known STD exposures. He states his sexual partner recently tested negative and had no similar lesions. He states that he has been itching the lesions as well as attempted to pop them with a small amount of clear fluid drainage. Since then, he has had increasing pain. Denies any associated dysuria. Denies any hematuria. Denies any fevers or chills. He has no history of STDs. He is otherwise healthy  Past Medical History:  Diagnosis Date  . Allergy     There are no active problems to display for this patient.   History reviewed. No pertinent surgical history.     Home Medications    Prior to Admission medications   Medication Sig Start Date End Date Taking? Authorizing Provider  cephALEXin (KEFLEX) 500 MG capsule Take 1 capsule (500 mg total) by mouth 3 (three) times daily. 01/11/16   Shaune Pollack, MD  ciprofloxacin (CILOXAN) 0.3 % ophthalmic solution Place 1 drop into both eyes every 4 (four) hours while awake. Administer 1 drop, every 1 hours, while awake, for 1 days. Then 1 drop, every 4 hours, while awake, for the next 5 days. Patient not taking: Reported on 07/14/2015 12/23/14   Joycie Peek, PA-C  cyclobenzaprine (FLEXERIL) 10 MG tablet Take 1 tablet (10 mg total) by mouth 3 (three) times daily as needed for muscle spasms. Patient not taking: Reported on 09/10/2015 07/14/15   Collie Siad English, PA  meloxicam  (MOBIC) 15 MG tablet Take 1 tablet (15 mg total) by mouth daily. Patient not taking: Reported on 09/10/2015 07/14/15   Collie Siad English, PA  sulfamethoxazole-trimethoprim (BACTRIM DS,SEPTRA DS) 800-160 MG tablet Take 1 tablet by mouth 2 (two) times daily. 09/10/15   Garnetta Buddy, PA  trimethoprim-polymyxin b (POLYTRIM) ophthalmic solution Place 1 drop into the left eye every 4 (four) hours. Take for 5 days. 10/11/15   Samantha Tripp Dowless, PA-C  valACYclovir (VALTREX) 1000 MG tablet Take 1 tablet (1,000 mg total) by mouth 2 (two) times daily. 01/11/16   Shaune Pollack, MD    Family History Family History  Problem Relation Age of Onset  . Hyperlipidemia Mother   . Hypertension Mother   . Diabetes Maternal Grandmother   . Heart disease Maternal Grandmother   . Hyperlipidemia Maternal Grandmother   . Hypertension Maternal Grandmother   . Cancer Maternal Grandfather   . Hyperlipidemia Maternal Grandfather   . Mental illness Maternal Grandfather   . Diabetes Paternal Grandmother   . Hyperlipidemia Paternal Grandmother   . Cancer Paternal Grandfather   . Hyperlipidemia Paternal Grandfather     Social History Social History  Substance Use Topics  . Smoking status: Never Smoker  . Smokeless tobacco: Never Used  . Alcohol use 1.8 oz/week    3 Cans of beer per week     Allergies   Review of patient's allergies indicates no known allergies.   Review of Systems Review of  Systems  Constitutional: Negative for chills, fatigue and fever.  HENT: Negative for congestion and rhinorrhea.   Eyes: Negative for visual disturbance.  Respiratory: Negative for cough, shortness of breath and wheezing.   Cardiovascular: Negative for chest pain and leg swelling.  Gastrointestinal: Negative for abdominal pain, diarrhea, nausea and vomiting.  Genitourinary: Positive for genital sores and penile swelling. Negative for discharge, dysuria, flank pain and testicular pain.  Musculoskeletal: Negative  for neck pain and neck stiffness.  Skin: Negative for rash and wound.  Allergic/Immunologic: Negative for immunocompromised state.  Neurological: Negative for syncope, weakness and headaches.     Physical Exam Updated Vital Signs BP 152/63 (BP Location: Left Wrist)   Pulse 61   Temp 98.5 F (36.9 C) (Oral)   Resp 19   SpO2 98%   Physical Exam  Constitutional: He appears well-developed and well-nourished. No distress.  HENT:  Head: Normocephalic.  Mouth/Throat: Oropharynx is clear and moist. No oropharyngeal exudate.  Eyes: Conjunctivae are normal. Pupils are equal, round, and reactive to light.  Neck: Neck supple.  Cardiovascular: Normal rate, regular rhythm and normal heart sounds.  Exam reveals no friction rub.   No murmur heard. Pulmonary/Chest: Effort normal and breath sounds normal. No respiratory distress. He has no wheezes.  Abdominal: Soft. Bowel sounds are normal. He exhibits no distension. There is no tenderness.  Genitourinary:  Genitourinary Comments: Small, papular lesions along dorsal shaft of penis. Small amount of surrounding erythema. Papules surround small hair follicles. No induration or fluctuance. No drainage. No inguinal LAD. No testicular pain or swelling and testicles descended b/l. No vesicular lesions.  Musculoskeletal: He exhibits no edema.  Neurological: He is alert. He exhibits normal muscle tone.  Nursing note and vitals reviewed.    ED Treatments / Results  Labs (all labs ordered are listed, but only abnormal results are displayed) Labs Reviewed  RPR  HIV ANTIBODY (ROUTINE TESTING)  GC/CHLAMYDIA PROBE AMP ([place]) NOT AT [facility]    EKG  EKG Interpretation None       Radiology No results found.  Procedures Procedures (including critical care time)  Medications Ordered in ED Medications  cefTRIAXone (ROCEPHIN) injection 250 mg (250 mg Intramuscular Given 01/11/16 1031)  azithromycin (ZITHROMAX) tablet 1,000 mg (1,000 mg Oral  Given 01/11/16 1031)  sterile water (preservative free) injection (10 mLs  Given 01/11/16 1031)     Initial Impression / Assessment and Plan / ED Course  I have reviewed the triage vital signs and the nursing notes.  Pertinent labs & imaging results that were available during my care of the patient were reviewed by me and considered in my medical decision making (see chart for details).  Clinical Course  Comment By Time  25 year old male who presents with initially pruritic and now mildly painful and erythematous penile lesions. See history of present illness above. Examination is consistent with likely mild folliculitis. He has no significant induration, fluctuance or evidence of cellulitis or abscess. Primary herpetic lesion is on the differential, although the lesions were initially pruritic, which is more consistent with possible contact dermatitis. The patient does use condoms. The lesions are not consistent with syphilitic lesions. He has no associated lymphadenopathy. As mentioned, his partner recently tested negative for STDs. However, given the location and reported vesicular like rash, will treat empirically for possible primary herpes infection, as well as give Keflex for folliculitis. I have tested the patient. Will advise close follow-up and discussed safe sex practice with the patient until results are back.  Shaune Pollack, MD 08/02 1032    Final Clinical Impressions(s) / ED Diagnoses   Final diagnoses:  Penile rash  Folliculitis    New Prescriptions Discharge Medication List as of 01/11/2016 10:27 AM    START taking these medications   Details  cephALEXin (KEFLEX) 500 MG capsule Take 1 capsule (500 mg total) by mouth 3 (three) times daily., Starting Wed 01/11/2016, Print    valACYclovir (VALTREX) 1000 MG tablet Take 1 tablet (1,000 mg total) by mouth 2 (two) times daily., Starting Wed 01/11/2016, Print         Shaune Pollack, MD 01/11/16 2015

## 2016-01-11 NOTE — ED Notes (Signed)
GC/Chlamydia Probe at bedside.

## 2016-01-11 NOTE — ED Triage Notes (Signed)
Patient here with complaints of rash to genital area that he noticed last night. Itching and burning. Denies fever.

## 2016-01-11 NOTE — Discharge Instructions (Signed)
You have been seen today in the Emergency Department (ED) for penile rash. This is likely due to a skin infection around your hair follicles. That being said, given the location we have tested you and will treat you for possible STDs. We have prescribed you an antibiotic for the skin infection, as well as an antiviral in the event that this is herpes infection, although this is less likely. It is important, however, to take the medications to prevent worsening or spread of infection.  It is important to refrain from sexual activity until your tests results return. We will contact you within the next week if results are positive.  Please follow up with your doctor as soon as possible regarding today?s ED visit and your symptoms.   Return to the ED if your pain worsens, you develop a fever, or for any other symptoms that concern you.

## 2016-01-12 LAB — RPR: RPR Ser Ql: NONREACTIVE

## 2016-01-12 LAB — GC/CHLAMYDIA PROBE AMP (~~LOC~~) NOT AT ARMC
Chlamydia: NEGATIVE
Neisseria Gonorrhea: NEGATIVE

## 2016-01-12 LAB — HIV ANTIBODY (ROUTINE TESTING W REFLEX): HIV Screen 4th Generation wRfx: NONREACTIVE

## 2016-06-15 ENCOUNTER — Ambulatory Visit (INDEPENDENT_AMBULATORY_CARE_PROVIDER_SITE_OTHER): Payer: Federal, State, Local not specified - PPO | Admitting: Family Medicine

## 2016-06-15 ENCOUNTER — Ambulatory Visit (INDEPENDENT_AMBULATORY_CARE_PROVIDER_SITE_OTHER): Payer: Federal, State, Local not specified - PPO

## 2016-06-15 VITALS — BP 138/72 | HR 71 | Temp 98.6°F | Ht 72.0 in | Wt 293.4 lb

## 2016-06-15 DIAGNOSIS — R079 Chest pain, unspecified: Secondary | ICD-10-CM

## 2016-06-15 MED ORDER — NAPROXEN 500 MG PO TABS: 500.0000 mg | ORAL_TABLET | Freq: Two times a day (BID) | ORAL | 0 refills | Status: DC

## 2016-06-15 NOTE — Progress Notes (Signed)
Patient ID: Stephen Hansen, male    DOB: [birth year], 26 y.o.   MRN: [id number]  PCP: No PCP Per Patient  Chief Complaint  Patient presents with  . Chest Pain    X 1 1/2 week right side chest pain     Subjective:  HPI  26 year old male presents for evaluation of chest pain 1.5 weeks.  Chest pain on right side feels like the sensation of some something heavy laying on his chest. No recent injury to the chest or known heavy lifting. No significant cardiac hx with the exception of mother has a heart murmur. He is a former smoker, some shortness of breath associated with strenuous activity. Patient associates dyspnea with weight and reports he has recently began to work to decrease overall weight.  Social History   Social History  . Marital status: Single    Spouse name: N/A  . Number of children: N/A  . Years of education: N/A   Occupational History  . Not on file.   Social History Main Topics  . Smoking status: Never Smoker  . Smokeless tobacco: Never Used  . Alcohol use 1.8 oz/week    3 Cans of beer per week  . Drug use: No  . Sexual activity: Not on file   Other Topics Concern  . Not on file   Social History Narrative  . No narrative on file    Family History  Problem Relation Age of Onset  . Hyperlipidemia Mother   . Hypertension Mother   . Diabetes Maternal Grandmother   . Heart disease Maternal Grandmother   . Hyperlipidemia Maternal Grandmother   . Hypertension Maternal Grandmother   . Cancer Maternal Grandfather   . Hyperlipidemia Maternal Grandfather   . Mental illness Maternal Grandfather   . Diabetes Paternal Grandmother   . Hyperlipidemia Paternal Grandmother   . Cancer Paternal Grandfather   . Hyperlipidemia Paternal Grandfather    Review of Systems See HPI There are no active problems to display for this patient.  No Known Allergies  Prior to Admission medications   Medication Sig Start Date End Date Taking? Authorizing Provider    valACYclovir (VALTREX) 1000 MG tablet Take 1 tablet (1,000 mg total) by mouth 2 (two) times daily. 01/11/16  Yes Shaune Pollack, MD  cephALEXin (KEFLEX) 500 MG capsule Take 1 capsule (500 mg total) by mouth 3 (three) times daily. Patient not taking: Reported on 06/15/2016 01/11/16   Shaune Pollack, MD  ciprofloxacin (CILOXAN) 0.3 % ophthalmic solution Place 1 drop into both eyes every 4 (four) hours while awake. Administer 1 drop, every 1 hours, while awake, for 1 days. Then 1 drop, every 4 hours, while awake, for the next 5 days. Patient not taking: Reported on 06/15/2016 12/23/14   Joycie Peek, PA-C  cyclobenzaprine (FLEXERIL) 10 MG tablet Take 1 tablet (10 mg total) by mouth 3 (three) times daily as needed for muscle spasms. Patient not taking: Reported on 06/15/2016 07/14/15   Collie Siad English, PA  meloxicam (MOBIC) 15 MG tablet Take 1 tablet (15 mg total) by mouth daily. Patient not taking: Reported on 06/15/2016 07/14/15   Collie Siad English, PA  sulfamethoxazole-trimethoprim (BACTRIM DS,SEPTRA DS) 800-160 MG tablet Take 1 tablet by mouth 2 (two) times daily. Patient not taking: Reported on 06/15/2016 09/10/15   Collie Siad English, PA  trimethoprim-polymyxin b (POLYTRIM) ophthalmic solution Place 1 drop into the left eye every 4 (four) hours. Take for 5 days. Patient not taking: Reported on 06/15/2016 10/11/15  Dub MikesSamantha Tripp Dowless, PA-C    Past Medical, Surgical Family and Social History reviewed and updated.    Objective:   Today's Vitals   06/15/16 1037  BP: 138/72  Pulse: 71  Temp: 98.6 F (37 C)  TempSrc: Oral  SpO2: 97%  Weight: 293 lb 6.4 oz (133.1 kg)  Height: 6' (1.829 m)    Wt Readings from Last 3 Encounters:  06/15/16 293 lb 6.4 oz (133.1 kg)  10/11/15 270 lb (122.5 kg)  09/10/15 280 lb (127 kg)   Physical Exam  Constitutional: He is oriented to person, place, and time. He appears well-developed and well-nourished.  HENT:  Head: Normocephalic and atraumatic.  Right  Ear: External ear normal.  Left Ear: External ear normal.  Nose: Nose normal.  Mouth/Throat: Oropharynx is clear and moist.  Eyes: Pupils are equal, round, and reactive to light.  Cardiovascular: Normal rate, regular rhythm, normal heart sounds and intact distal pulses.   No murmur heard. Pulmonary/Chest: Effort normal and breath sounds normal.  Musculoskeletal: Normal range of motion.  Neurological: He is alert and oriented to person, place, and time.  Skin: Skin is warm and dry.  Psychiatric: He has a normal mood and affect. His behavior is normal. Judgment and thought content normal.      Assessment & Plan:  1. Chest pain, unspecified type, likely pleuritic pain - DG Chest 2 View - EKG 12-Lead  Plan: Take Naproxen 500 mg twice daily x 14 days.  If pain persists after that point, return for follow-up for additional work-up.   Godfrey PickKimberly S. Tiburcio PeaHarris, MSN, FNP-C [facility] [phone]

## 2016-06-15 NOTE — Patient Instructions (Addendum)
I am treating for acute pleural chest wall pain.  Take Naproxen 500 mg twice daily x 14 days. If pain persists after that point, return for follow-up for additional work-up.   IF you received an x-ray today, you will receive an invoice from [facility]. Please contact [facility] at [phone] with questions or concerns regarding your invoice.   IF you received labwork today, you will receive an invoice from [facility]. Please contact [facility] at [phone] with questions or concerns regarding your invoice.   Our billing staff will not be able to assist you with questions regarding bills from these companies.  You will be contacted with the lab results as soon as they are available. The fastest way to get your results is to activate your My Chart account. Instructions are located on the last page of this paperwork. If you have not heard from us regarding the results in 2 weeks, please contact this office.     Chest Wall Pain Introduction Chest wall pain is pain in or around the bones and muscles of your chest. Sometimes, an injury causes this pain. Sometimes, the cause may not be known. This pain may take several weeks or longer to get better. Follow these instructions at home: Pay attention to any changes in your symptoms. Take these actions to help with your pain:  Rest as told by your doctor.  Avoid activities that cause pain. Try not to use your chest, belly (abdominal), or side muscles to lift heavy things.  If directed, apply ice to the painful area:  Put ice in a plastic bag.  Place a towel between your skin and the bag.  Leave the ice on for 20 minutes, 2-3 times per day.  Take over-the-counter and prescription medicines only as told by your doctor.  Do not use tobacco products, including cigarettes, chewing tobacco, and e-cigarettes. If you need help quitting, ask your doctor.  Keep all follow-up visits as told by your doctor. This is  important. Contact a doctor if:  You have a fever.  Your chest pain gets worse.  You have new symptoms. Get help right away if:  You feel sick to your stomach (nauseous) or you throw up (vomit).  You feel sweaty or light-headed.  You have a cough with phlegm (sputum) or you cough up blood.  You are short of breath. This information is not intended to replace advice given to you by your health care provider. Make sure you discuss any questions you have with your health care provider. Document Released: 11/14/2007 Document Revised: 11/03/2015 Document Reviewed: 08/23/2014  2017 Elsevier

## 2016-06-20 ENCOUNTER — Ambulatory Visit (INDEPENDENT_AMBULATORY_CARE_PROVIDER_SITE_OTHER): Payer: Federal, State, Local not specified - PPO | Admitting: Family Medicine

## 2016-06-20 ENCOUNTER — Encounter: Payer: Self-pay | Admitting: *Deleted

## 2016-06-20 VITALS — BP 140/96 | HR 75 | Temp 98.4°F | Resp 20 | Ht 70.5 in | Wt 291.0 lb

## 2016-06-20 DIAGNOSIS — M940 Chondrocostal junction syndrome [Tietze]: Secondary | ICD-10-CM | POA: Diagnosis not present

## 2016-06-20 DIAGNOSIS — R079 Chest pain, unspecified: Secondary | ICD-10-CM

## 2016-06-20 MED ORDER — OMEPRAZOLE 40 MG PO CPDR: 40.0000 mg | DELAYED_RELEASE_CAPSULE | Freq: Every day | ORAL | 0 refills | Status: DC

## 2016-06-20 NOTE — Patient Instructions (Addendum)
It was nice meeting you today Stephen Hansen!  You have something called costochondritis. This is inflammation of the cartilage of your ribs.  Treatment of this is time and anti-inflammatories.    Your reflux has been worsened by the Naproxen.  Take the omeprazole to relieve your reflux.    Let us know if you have any questions or concerns.   Costochondritis Costochondritis is swelling and irritation (inflammation) of the tissue (cartilage) that connects your ribs to your breastbone (sternum). This causes pain in the front of your chest. Usually, the pain:  Starts gradually.  Is in more than one rib. This condition usually goes away on its own over time. Follow these instructions at home:  Do not do anything that makes your pain worse.  If directed, put ice on the painful area:  Put ice in a plastic bag.  Place a towel between your skin and the bag.  Leave the ice on for 20 minutes, 2-3 times a day.  If directed, put heat on the affected area as often as told by your doctor. Use the heat source that your doctor tells you to use, such as a moist heat pack or a heating pad.  Place a towel between your skin and the heat source.  Leave the heat on for 20-30 minutes.  Take off the heat if your skin turns bright red. This is very important if you cannot feel pain, heat, or cold. You may have a greater risk of getting burned.  Take over-the-counter and prescription medicines only as told by your doctor.  Return to your normal activities as told by your doctor. Ask your doctor what activities are safe for you.  Keep all follow-up visits as told by your doctor. This is important. Contact a doctor if:  You have chills or a fever.  Your pain does not go away or it gets worse.  You have a cough that does not go away. Get help right away if:  You are short of breath. This information is not intended to replace advice given to you by your health care provider. Make sure you discuss  any questions you have with your health care provider. Document Released: 11/14/2007 Document Revised: 12/16/2015 Document Reviewed: 09/21/2015 Elsevier Interactive Patient Education  2017 [employer]    IF you received an x-ray today, you will receive an invoice from [facility]. Please contact [facility] at [phone] with questions or concerns regarding your invoice.   IF you received labwork today, you will receive an invoice from [facility]. Please contact [facility] at [phone] with questions or concerns regarding your invoice.   Our billing staff will not be able to assist you with questions regarding bills from these companies.  You will be contacted with the lab results as soon as they are available. The fastest way to get your results is to activate your My Chart account. Instructions are located on the last page of this paperwork. If you have not heard from us regarding the results in 2 weeks, please contact this office.

## 2016-06-20 NOTE — Progress Notes (Signed)
   Subjective:    Patient ID: Stephen Hansen, male    DOB: [birth year], 26 y.o.   MRN: [id number]  HPI  Patient presents with persistent R-sided chest pain.   R-sided chest pain Patient began having intermittent R-sided chest pain with certain movements around 01/01. He presented to UC on 01/05 for this problem. EKG and CXR were normal, and he was prescribed two weeks of Naprosyn BID.   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 He also experienced SOB and a burning/tingling sensation substernally both at work that day and once the following day. He has continued to take Naprosyn BID as prescribed.   He presents today because he is now having sharp pains when turning in bed at night and is becoming concerned that pain is not improving. He has not had any additional episodes of SOB or burning/tingling. Says pain is still located to R of midline, and describes the pain as feeling like a "ball of pain." Denies palpitations, racing heart. Pain does not radiate. Denies nausea or vomiting. Has had a mild cough recently, but not so severe that it would cause chest pain. Pain is not worse with exertion, only with certain movements. Pain is not improved with leaning forward.   Patient reports he was told he had a functional murmur when he was much younger, but has since grown out of it. His grandmother had a pacemaker placed when she was very old. His mother has a murmur as well. Denies family history of cardiac disease otherwise. Denies sudden deaths at a young age in the family.   Social: Patient smokes cigarettes once or twice per month in social settings.   Review of Systems     Objective:   Physical Exam  Constitutional: He is oriented to person, place, and time.  Obese male in NAD  HENT:  Head: Normocephalic and atraumatic.  Eyes: Conjunctivae and EOM are normal. Right eye exhibits no discharge. Left eye exhibits no discharge.    Cardiovascular: Normal rate, regular rhythm and normal heart sounds.   No murmur heard. Pulmonary/Chest: Effort normal and breath sounds normal. No respiratory distress. He has no wheezes.  No TTP of chest wall  Musculoskeletal:  Unable to reproduce pain with movements  Neurological: He is alert and oriented to person, place, and time.  Skin: Skin is warm and dry.  Psychiatric: He has a normal mood and affect. His behavior is normal.      Assessment & Plan:  No problem-specific Assessment & Plan notes found for this encounter.  Tarri AbernethyAbigail J Sweden Lesure, MD, MPH PGY-2 [facility] Pager [phone]

## 2016-06-20 NOTE — Progress Notes (Signed)
   Stephen LippsJonathan Hansen is a 26 y.o. male who presents to [facility] today for chest pain:  1.  Chest pain:  See resident note for full details.  Briefly -- patient with 10 day history of sharp, right-sided chest pain.  No triggers or inciting factors.  No injuries.  Works in [employer] at [employer] and lots of heavy lifting.  Chest pain is fairly constant throughout the day, worse with lifting and certain arm positions, not affected by actual exertion, walking around, cardiovascular exercise, etc.  Had 1 episode of dyspnea overnight this past weekend, which worried him.  Pain now accompanied by "burning" in chest worse with meals.  Burping.  No other diaphoresis, nausea, vomiting.  NO further dyspnea/SOB episodes.  No fevers or chills.    No long car rides or travel.  Uses 1 finger to point where he has pain, which is directly lateral to Right sternal border.  Has had some improvement with naproxen after being seen here for same 1/05.  Normal CXR and EKG at that time.  Smokes 1-2 cigarettes per month.  No family history of heart disease.  No history of diabetes or HTN.  No chronic medical conditions.   ROS as above.  PMH reviewed. Patient is a nonsmoker.   Past Medical History:  Diagnosis Date  . Allergy    No past surgical history on file.  Medications reviewed. Current Outpatient Prescriptions  Medication Sig Dispense Refill  . naproxen (NAPROSYN) 500 MG tablet Take 1 tablet (500 mg total) by mouth 2 (two) times daily with a meal. 30 tablet 0  . omeprazole (PRILOSEC) 40 MG capsule Take 1 capsule (40 mg total) by mouth daily. 30 capsule 0  . valACYclovir (VALTREX) 1000 MG tablet Take 1 tablet (1,000 mg total) by mouth 2 (two) times daily. (Patient not taking: Reported on 06/20/2016) 20 tablet 0   No current facility-administered medications for this visit.      Physical Exam:  BP (!) 140/96 (BP Location: Right Arm, Patient Position: Sitting, Cuff Size: Large)   Pulse 75    Temp 98.4 F (36.9 C) (Oral)   Resp 20   Ht 5' 10.5" (1.791 m)   Wt 291 lb (132 kg)   SpO2 96%   BMI 41.16 kg/m  Gen:  Alert, cooperative patient who appears stated age in no acute distress.  Vital signs reviewed. HEENT: EOMI,  MMM Pulm:  Clear to auscultation bilaterally with good air movement.  No wheezes or rales noted.  Chest:  TTP directly at Right sternal border Ribs 4-5.   Cardiac:  Regular rate and rhythm without murmur auscultated.  Good S1/S2. Abd:  Soft/nondistended/nontender.  Exts: Non edematous BL  LE, warm and well perfused.   Assessment and Plan:  1.  Costochondritis: - most likely etiology based on description of pain, history, and physical.   - Very low risk patient, only has obesity.  No family history of heart disease.  No personal history of chronic illness.  Very irregular (1-2 per month) cigarette usage.   - no chest pain on exertion, no improvement with rest, has had improvement with naproxen.   - reassured patient.  Discussed diagnosis and prognosis of another 1-2 weeks likely, especially as he cannot take off time from work and has ongoing heavy lifting.

## 2016-08-18 ENCOUNTER — Ambulatory Visit (INDEPENDENT_AMBULATORY_CARE_PROVIDER_SITE_OTHER): Payer: Federal, State, Local not specified - PPO

## 2016-08-18 ENCOUNTER — Ambulatory Visit: Payer: Federal, State, Local not specified - PPO

## 2016-08-18 ENCOUNTER — Ambulatory Visit (INDEPENDENT_AMBULATORY_CARE_PROVIDER_SITE_OTHER): Payer: Federal, State, Local not specified - PPO | Admitting: Physician Assistant

## 2016-08-18 VITALS — BP 138/84 | HR 105 | Temp 98.3°F | Resp 18 | Ht 71.0 in | Wt 302.0 lb

## 2016-08-18 DIAGNOSIS — M545 Low back pain, unspecified: Secondary | ICD-10-CM

## 2016-08-18 DIAGNOSIS — M48061 Spinal stenosis, lumbar region without neurogenic claudication: Secondary | ICD-10-CM | POA: Diagnosis not present

## 2016-08-18 MED ORDER — MELOXICAM 15 MG PO TABS: 15.0000 mg | ORAL_TABLET | Freq: Every day | ORAL | 1 refills | Status: DC

## 2016-08-18 MED ORDER — PREDNISONE 20 MG PO TABS: ORAL_TABLET | ORAL | 0 refills | Status: DC

## 2016-08-18 NOTE — Progress Notes (Signed)
[facility] [address], [place] [address] [phone]  Date:  08/18/2016   Name:  Stephen Hansen   DOB:  28-Nov-1990   MRN:  [id number]  PCP:  No PCP Per Patient    History of Present Illness:  Stephen Hansen is a 26 y.o. male patient who presents to [facility] for cc of low back pain.  2 weeks ago, back pain progressively worsened.  He has tingling bilaterally down his buttock and upper thigh.   Left side of low back appears to be swelling after he will do dead lifts.  Back feels stiff. Numbness when he placed ice on the back and could not feel it.  Endorses that his legs will feel weak.  No fecal incontinence.   He has been doing stretches.   Started back exercising 4 days before the low back started.   He was doing back stretches before he would exercise.   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    Wt Readings from Last 3 Encounters:  08/18/16 (!) 302 lb (137 kg)  06/20/16 291 lb (132 kg)  06/15/16 293 lb 6.4 oz (133.1 kg)     Patient Active Problem List   Diagnosis Date Noted  . Right-sided chest pain 06/20/2016    Past Medical History:  Diagnosis Date  . Allergy     No past surgical history on file.  Social History  Substance Use Topics  . Smoking status: Never Smoker  . Smokeless tobacco: Never Used  . Alcohol use 1.8 oz/week    3 Cans of beer per week    Family History  Problem Relation Age of Onset  . Hyperlipidemia Mother   . Hypertension Mother   . Diabetes Maternal Grandmother   . Heart disease Maternal Grandmother   . Hyperlipidemia Maternal Grandmother   . Hypertension Maternal Grandmother   . Cancer Maternal Grandfather   . Hyperlipidemia Maternal Grandfather   . Mental illness Maternal Grandfather   . Diabetes Paternal Grandmother   . Hyperlipidemia Paternal Grandmother   . Cancer Paternal Grandfather   . Hyperlipidemia Paternal Grandfather     No Known Allergies  Medication list has been reviewed  and updated.  Current Outpatient Prescriptions on File Prior to Visit  Medication Sig Dispense Refill  . naproxen (NAPROSYN) 500 MG tablet Take 1 tablet (500 mg total) by mouth 2 (two) times daily with a meal. (Patient not taking: Reported on 08/18/2016) 30 tablet 0  . omeprazole (PRILOSEC) 40 MG capsule Take 1 capsule (40 mg total) by mouth daily. (Patient not taking: Reported on 08/18/2016) 30 capsule 0  . valACYclovir (VALTREX) 1000 MG tablet Take 1 tablet (1,000 mg total) by mouth 2 (two) times daily. (Patient not taking: Reported on 06/20/2016) 20 tablet 0   No current facility-administered medications on file prior to visit.     ROS ROS otherwise unremarkable unless listed above.  Physical Examination: BP 138/84   Pulse (!) 105   Temp 98.3 F (36.8 C)   Resp 18   Ht 5\' 11"  (1.803 m)   Wt (!) 302 lb (137 kg)   SpO2 96%   BMI 42.12 kg/m  Ideal Body Weight: Weight in (lb) to have BMI = 25: 178.9  Physical Exam  Constitutional: He is oriented to person, place, and time. He appears well-developed and well-nourished. No distress.  HENT:  Head: Normocephalic and atraumatic.  Right Ear: Tympanic membrane, external ear and ear canal normal.  Left Ear: Tympanic membrane, external ear and ear canal normal.  Nose: Mucosal edema and rhinorrhea present. Right sinus exhibits no maxillary sinus tenderness and no frontal sinus tenderness. Left sinus exhibits no maxillary sinus tenderness and no frontal sinus tenderness.  Mouth/Throat: No uvula swelling. No oropharyngeal exudate, posterior oropharyngeal edema or posterior oropharyngeal erythema.  Eyes: Conjunctivae, EOM and lids are normal. Pupils are equal, round, and reactive to light. Right eye exhibits normal extraocular motion. Left eye exhibits normal extraocular motion.  Neck: Trachea normal and full passive range of motion without pain. No edema and no erythema present.  Cardiovascular: Normal rate, regular rhythm, normal heart sounds  and intact distal pulses.  Exam reveals no friction rub.   No murmur heard. Pulmonary/Chest: Effort normal and breath sounds normal. No respiratory distress. He has no decreased breath sounds. He has no wheezes. He has no rhonchi.  Musculoskeletal:  Lower lumbar spinous tenderness.  Left musculature tenderness adjacent to vertebral tenderness.  Neurological: He is alert and oriented to person, place, and time.  Skin: Skin is warm and dry. He is not diaphoretic.  Psychiatric: He has a normal mood and affect. His behavior is normal.   Dg Lumbar Spine Complete  Result Date: 08/18/2016 CLINICAL DATA:  Spinous process tenderness, no known injury, initial encounter EXAM: LUMBAR SPINE - COMPLETE 4+ VIEW COMPARISON:  None. FINDINGS: Five lumbar type vertebral bodies are well visualized. Vertebral body height is well maintained. Mild disc space narrowing is noted at L4-5. No pars defects or anterolisthesis is noted. No soft tissue abnormality is seen. IMPRESSION: Minimal disc space narrowing at L4-5. Electronically Signed   By: Alcide CleverMark  Lukens M.D.   On: 08/18/2016 15:57    Assessment and Plan: Stephen Hansen is a 26 y.o. male who is here today for cc of low back pain. This is recurrent and with strenuous work schedule, he should have evaluation from orthopedist.  Consult appreciated and placed today.   mobic and precautions discussed.  Taper prednisone given today.  Discussed lighter duties such as no more than 30-40lb lifting at this time. Tenderness of left lumbar region - Plan: DG Lumbar Spine Complete, meloxicam (MOBIC) 15 MG tablet, predniSONE (DELTASONE) 20 MG tablet  Stephen PlattStephanie Keita Demarco, PA-C [facility] 3/11/20187:52 PM

## 2016-08-18 NOTE — Patient Instructions (Addendum)
Please ice the back three times per day for 15 minutes I am placing a referral to the orthopedist so please await that contact If you are not contacted bout this, please call our office after 1 week.  Back Pain, Adult Back pain is very common. The pain often gets better over time. The cause of back pain is usually not dangerous. Most people can learn to manage their back pain on their own. Follow these instructions at home: Watch your back pain for any changes. The following actions may help to lessen any pain you are feeling:  Stay active. Start with short walks on flat ground if you can. Try to walk farther each day.  Exercise regularly as told by your doctor. Exercise helps your back heal faster. It also helps avoid future injury by keeping your muscles strong and flexible.  Do not sit, drive, or stand in one place for more than 30 minutes.  Do not stay in bed. Resting more than 1-2 days can slow down your recovery.  Be careful when you bend or lift an object. Use good form when lifting:  Bend at your knees.  Keep the object close to your body.  Do not twist.  Sleep on a firm mattress. Lie on your side, and bend your knees. If you lie on your back, put a pillow under your knees.  Take medicines only as told by your doctor.  Put ice on the injured area.  Put ice in a plastic bag.  Place a towel between your skin and the bag.  Leave the ice on for 20 minutes, 2-3 times a day for the first 2-3 days. After that, you can switch between ice and heat packs.  Avoid feeling anxious or stressed. Find good ways to deal with stress, such as exercise.  Maintain a healthy weight. Extra weight puts stress on your back. Contact a doctor if:  You have pain that does not go away with rest or medicine.  You have worsening pain that goes down into your legs or buttocks.  You have pain that does not get better in one week.  You have pain at night.  You lose weight.  You have a fever  or chills. Get help right away if:  You cannot control when you poop (bowel movement) or pee (urinate).  Your arms or legs feel weak.  Your arms or legs lose feeling (numbness).  You feel sick to your stomach (nauseous) or throw up (vomit).  You have belly (abdominal) pain.  You feel like you may pass out (faint). This information is not intended to replace advice given to you by your health care provider. Make sure you discuss any questions you have with your health care provider. Document Released: 11/14/2007 Document Revised: 11/03/2015 Document Reviewed: 09/29/2013 Elsevier Interactive Patient Education  2017 [employer]    IF you received an x-ray today, you will receive an invoice from [facility]. Please contact [facility] at [phone] with questions or concerns regarding your invoice.   IF you received labwork today, you will receive an invoice from [facility]. Please contact [facility] at [phone] with questions or concerns regarding your invoice.   Our billing staff will not be able to assist you with questions regarding bills from these companies.  You will be contacted with the lab results as soon as they are available. The fastest way to get your results is to activate your My Chart account. Instructions are located on the last page of this  paperwork. If you have not heard from us regarding the results in 2 weeks, please contact this office.      

## 2016-08-19 ENCOUNTER — Encounter: Payer: Self-pay | Admitting: Physician Assistant

## 2016-08-27 ENCOUNTER — Telehealth: Payer: Self-pay | Admitting: Physician Assistant

## 2016-08-27 DIAGNOSIS — M545 Low back pain, unspecified: Secondary | ICD-10-CM

## 2016-08-27 DIAGNOSIS — M9979 Connective tissue and disc stenosis of intervertebral foramina of abdomen and other regions: Secondary | ICD-10-CM

## 2016-08-27 NOTE — Telephone Encounter (Signed)
Ortho  consult  sent

## 2016-08-30 DIAGNOSIS — M545 Low back pain: Secondary | ICD-10-CM | POA: Diagnosis not present

## 2016-08-30 DIAGNOSIS — M4716 Other spondylosis with myelopathy, lumbar region: Secondary | ICD-10-CM | POA: Diagnosis not present

## 2016-08-30 DIAGNOSIS — M549 Dorsalgia, unspecified: Secondary | ICD-10-CM | POA: Diagnosis not present

## 2016-09-05 DIAGNOSIS — M545 Low back pain: Secondary | ICD-10-CM | POA: Diagnosis not present

## 2016-09-07 DIAGNOSIS — M545 Low back pain: Secondary | ICD-10-CM | POA: Diagnosis not present

## 2016-09-19 DIAGNOSIS — M545 Low back pain: Secondary | ICD-10-CM | POA: Diagnosis not present

## 2016-09-20 DIAGNOSIS — M545 Low back pain: Secondary | ICD-10-CM | POA: Diagnosis not present

## 2016-09-24 DIAGNOSIS — M545 Low back pain: Secondary | ICD-10-CM | POA: Diagnosis not present

## 2016-10-01 DIAGNOSIS — M545 Low back pain: Secondary | ICD-10-CM | POA: Diagnosis not present

## 2016-10-05 DIAGNOSIS — M4716 Other spondylosis with myelopathy, lumbar region: Secondary | ICD-10-CM | POA: Diagnosis not present

## 2016-10-05 DIAGNOSIS — M791 Myalgia: Secondary | ICD-10-CM | POA: Diagnosis not present

## 2016-10-05 DIAGNOSIS — M545 Low back pain: Secondary | ICD-10-CM | POA: Diagnosis not present

## 2017-01-22 DIAGNOSIS — M545 Low back pain: Secondary | ICD-10-CM | POA: Diagnosis not present

## 2017-01-22 DIAGNOSIS — M47816 Spondylosis without myelopathy or radiculopathy, lumbar region: Secondary | ICD-10-CM | POA: Diagnosis not present

## 2017-01-22 DIAGNOSIS — M791 Myalgia: Secondary | ICD-10-CM | POA: Diagnosis not present

## 2017-02-07 DIAGNOSIS — M545 Low back pain: Secondary | ICD-10-CM | POA: Diagnosis not present

## 2017-02-07 DIAGNOSIS — Z6841 Body Mass Index (BMI) 40.0 and over, adult: Secondary | ICD-10-CM | POA: Diagnosis not present

## 2017-02-07 DIAGNOSIS — M791 Myalgia: Secondary | ICD-10-CM | POA: Diagnosis not present

## 2017-02-07 DIAGNOSIS — M47816 Spondylosis without myelopathy or radiculopathy, lumbar region: Secondary | ICD-10-CM | POA: Diagnosis not present

## 2017-05-09 DIAGNOSIS — K080 Exfoliation of teeth due to systemic causes: Secondary | ICD-10-CM | POA: Diagnosis not present

## 2017-08-22 DIAGNOSIS — M545 Low back pain: Secondary | ICD-10-CM | POA: Diagnosis not present

## 2017-08-22 DIAGNOSIS — M4716 Other spondylosis with myelopathy, lumbar region: Secondary | ICD-10-CM | POA: Diagnosis not present

## 2017-10-11 DIAGNOSIS — M4716 Other spondylosis with myelopathy, lumbar region: Secondary | ICD-10-CM | POA: Diagnosis not present

## 2017-10-11 DIAGNOSIS — M545 Low back pain: Secondary | ICD-10-CM | POA: Diagnosis not present

## 2017-12-04 IMAGING — DX DG LUMBAR SPINE COMPLETE 4+V
5 series · 5 of 5 positions shown · non-contrast
Comparison: None.

CLINICAL DATA: Spinous process tenderness, no known injury, initial
encounter

EXAM:
LUMBAR SPINE - COMPLETE 4+ VIEW

[l-spine ap]
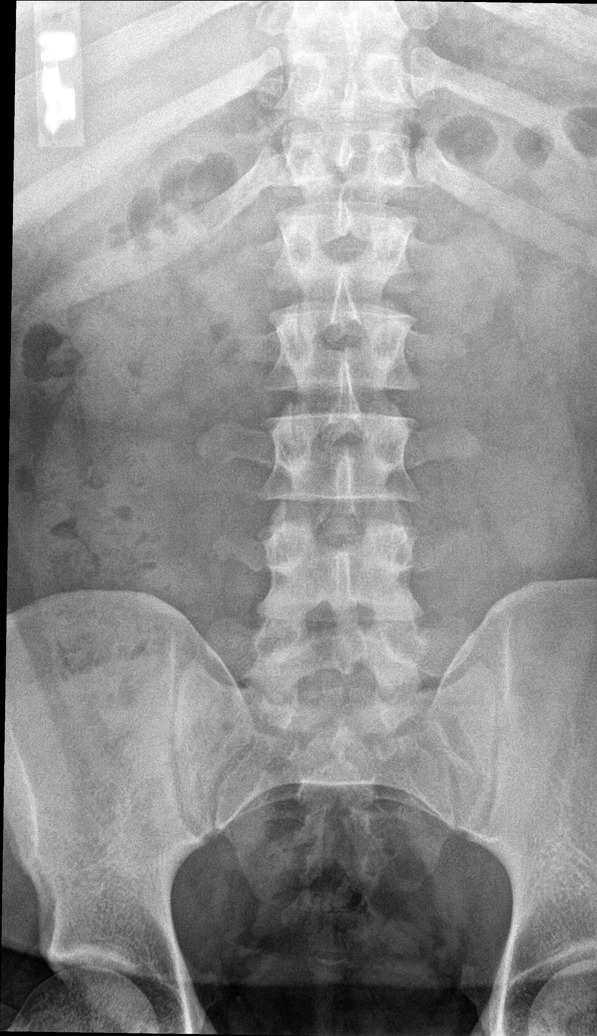

[l-spine obl (1 of 2)]
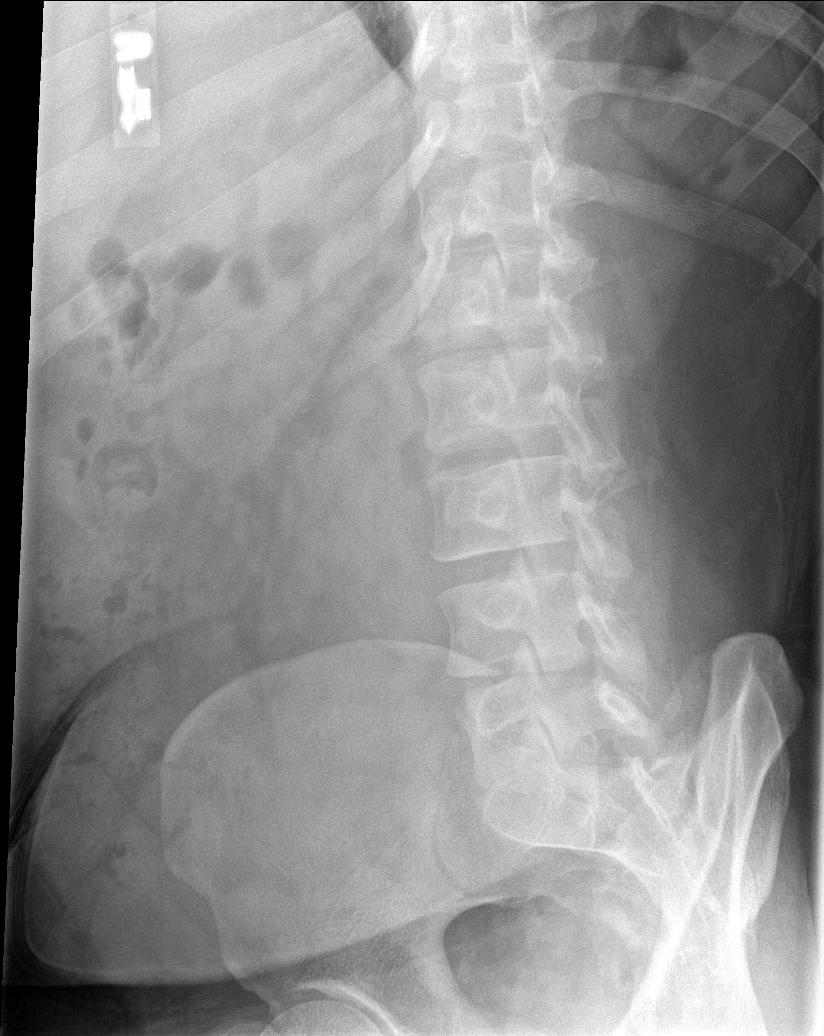

[l-spine obl (2 of 2)]
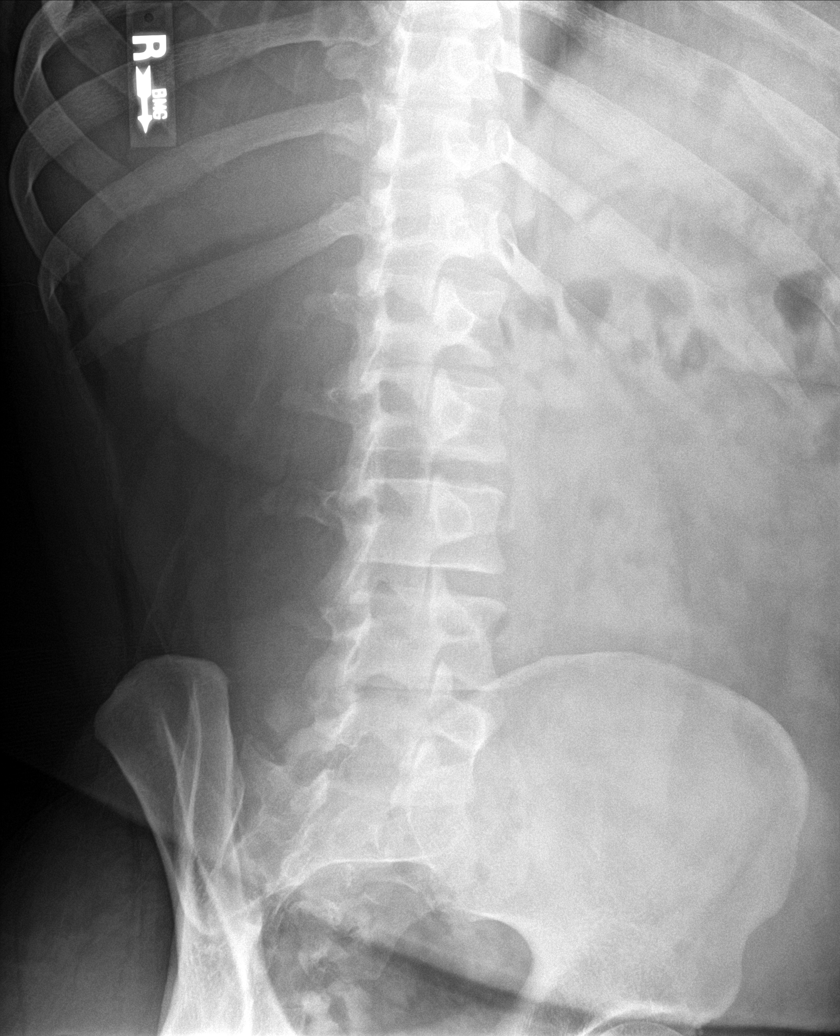

[l-spine lat]
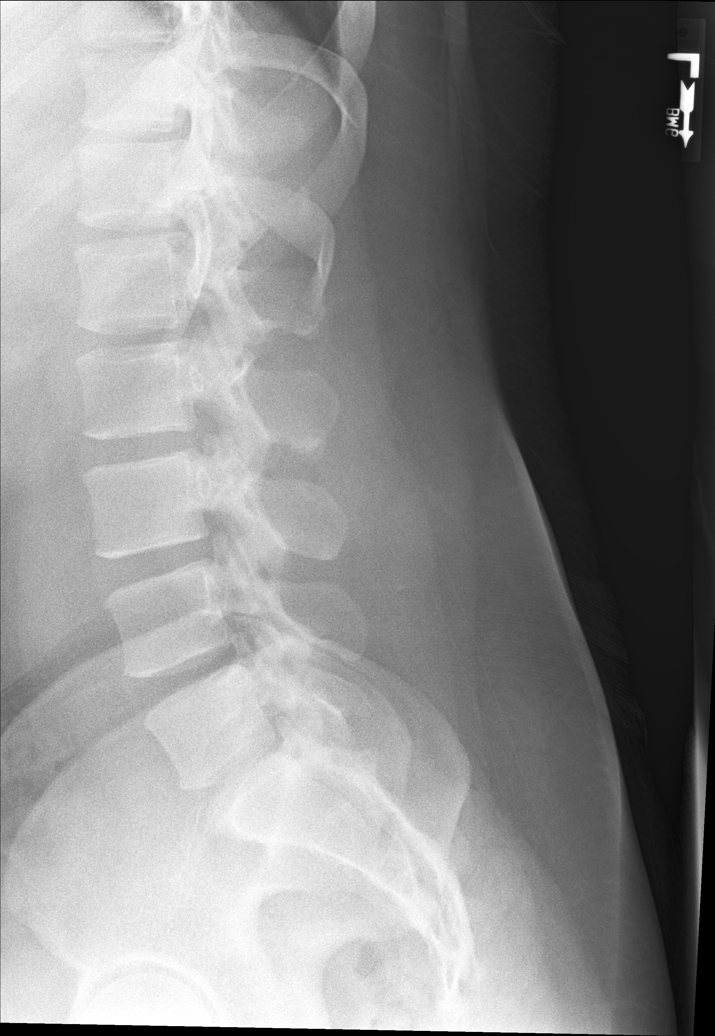

[l-spine l5-s1]
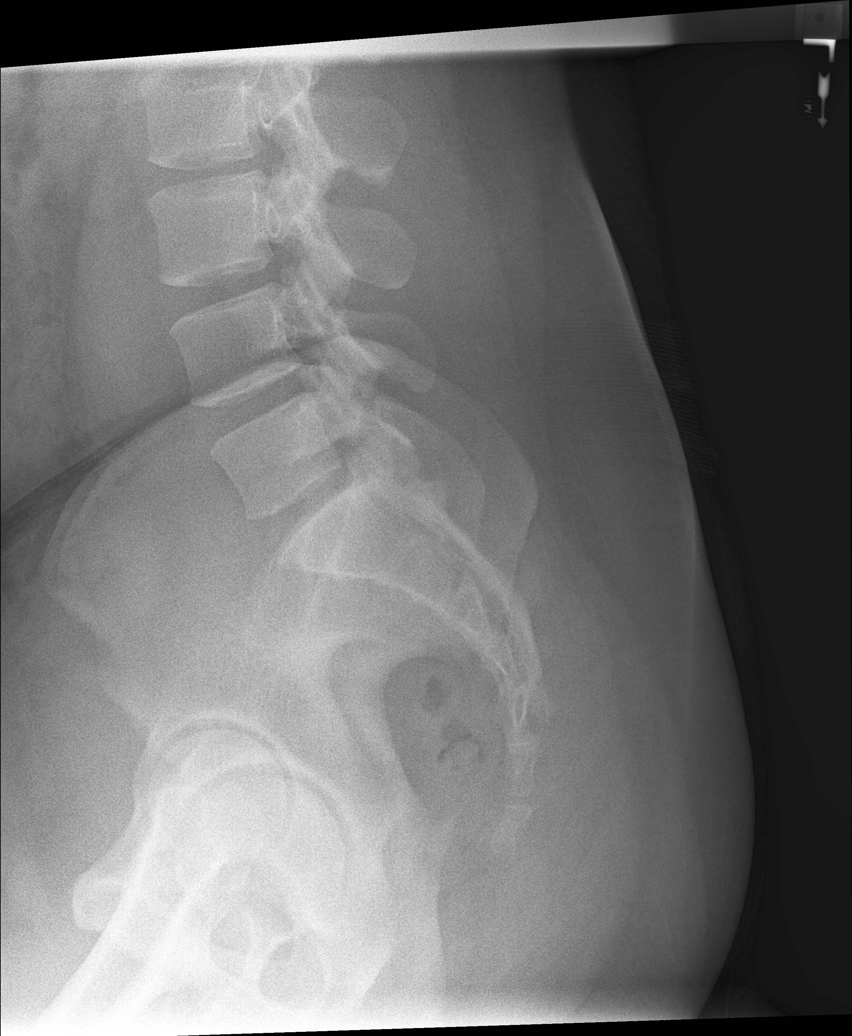

[5 of 5 positions shown; findings below may reference images not displayed]

FINDINGS: Five lumbar type vertebral bodies are well visualized. Vertebral
body height is well maintained. Mild disc space narrowing is noted
at L4-5. No pars defects or anterolisthesis is noted. No soft tissue
abnormality is seen.
IMPRESSION: Minimal disc space narrowing at L4-5.

## 2018-11-04 DIAGNOSIS — M4716 Other spondylosis with myelopathy, lumbar region: Secondary | ICD-10-CM | POA: Diagnosis not present

## 2018-11-04 DIAGNOSIS — M545 Low back pain: Secondary | ICD-10-CM | POA: Diagnosis not present

## 2019-04-02 ENCOUNTER — Other Ambulatory Visit: Payer: Self-pay

## 2019-04-02 ENCOUNTER — Encounter: Payer: Self-pay | Admitting: Emergency Medicine

## 2019-04-02 ENCOUNTER — Ambulatory Visit: Payer: Federal, State, Local not specified - PPO | Admitting: Emergency Medicine

## 2019-04-02 VITALS — BP 132/83 | HR 82 | Temp 98.1°F | Resp 16 | Ht 72.0 in | Wt 301.4 lb

## 2019-04-02 DIAGNOSIS — H60393 Other infective otitis externa, bilateral: Secondary | ICD-10-CM | POA: Diagnosis not present

## 2019-04-02 DIAGNOSIS — H9203 Otalgia, bilateral: Secondary | ICD-10-CM | POA: Diagnosis not present

## 2019-04-02 MED ORDER — HYDROCORTISONE-ACETIC ACID 1-2 % OT SOLN: 3.0000 [drp] | Freq: Three times a day (TID) | OTIC | 1 refills | Status: DC

## 2019-04-02 MED ORDER — AMOXICILLIN-POT CLAVULANATE 875-125 MG PO TABS: 1.0000 | ORAL_TABLET | Freq: Two times a day (BID) | ORAL | 0 refills | Status: AC

## 2019-04-02 NOTE — Progress Notes (Signed)
Stephen Hansen 28 y.o.   Chief Complaint  Patient presents with  . Ear Problem    both - per patient with fluid x 3 weeks    HISTORY OF PRESENT ILLNESS: This is a 28 y.o. male complaining of bilateral ear discomfort for the past 2 to 3 weeks.  Feels like there is "fluid in my ears".  Denies injury.  No other significant symptoms.  HPI   Prior to Admission medications   Medication Sig Start Date End Date Taking? Authorizing Provider  meloxicam (MOBIC) 15 MG tablet Take 1 tablet (15 mg total) by mouth daily. Patient not taking: Reported on 04/02/2019 08/18/16   Ivar Drape D, PA  naproxen (NAPROSYN) 500 MG tablet Take 1 tablet (500 mg total) by mouth 2 (two) times daily with a meal. Patient not taking: Reported on 04/02/2019 06/15/16   Scot Jun, FNP  omeprazole (PRILOSEC) 40 MG capsule Take 1 capsule (40 mg total) by mouth daily. Patient not taking: Reported on 04/02/2019 06/20/16   Alveda Reasons, MD  predniSONE (DELTASONE) 20 MG tablet Take 3 PO QAM x2days, 2 PO QAM x2days, 1 PO QAM x2days Patient not taking: Reported on 04/02/2019 08/18/16   Ivar Drape D, PA  valACYclovir (VALTREX) 1000 MG tablet Take 1 tablet (1,000 mg total) by mouth 2 (two) times daily. Patient not taking: Reported on 04/02/2019 01/11/16   Duffy Bruce, MD    No Known Allergies  There are no active problems to display for this patient.   Past Medical History:  Diagnosis Date  . Allergy     History reviewed. No pertinent surgical history.  Social History   Socioeconomic History  . Marital status: Single    Spouse name: Not on file  . Number of children: Not on file  . Years of education: Not on file  . Highest education level: Not on file  Occupational History  . Not on file  Social Needs  . Financial resource strain: Not on file  . Food insecurity    Worry: Not on file    Inability: Not on file  . Transportation needs    Medical: Not on file    Non-medical: Not  on file  Tobacco Use  . Smoking status: Never Smoker  . Smokeless tobacco: Never Used  Substance and Sexual Activity  . Alcohol use: Yes    Alcohol/week: 3.0 standard drinks    Types: 3 Cans of beer per week  . Drug use: No  . Sexual activity: Not on file  Lifestyle  . Physical activity    Days per week: Not on file    Minutes per session: Not on file  . Stress: Not on file  Relationships  . Social Herbalist on phone: Not on file    Gets together: Not on file    Attends religious service: Not on file    Active member of club or organization: Not on file    Attends meetings of clubs or organizations: Not on file    Relationship status: Not on file  . Intimate partner violence    Fear of current or ex partner: Not on file    Emotionally abused: Not on file    Physically abused: Not on file    Forced sexual activity: Not on file  Other Topics Concern  . Not on file  Social History Narrative  . Not on file    Family History  Problem Relation Age of Onset  .  Hyperlipidemia Mother   . Hypertension Mother   . Diabetes Maternal Grandmother   . Heart disease Maternal Grandmother   . Hyperlipidemia Maternal Grandmother   . Hypertension Maternal Grandmother   . Cancer Maternal Grandfather   . Hyperlipidemia Maternal Grandfather   . Mental illness Maternal Grandfather   . Diabetes Paternal Grandmother   . Hyperlipidemia Paternal Grandmother   . Cancer Paternal Grandfather   . Hyperlipidemia Paternal Grandfather      Review of Systems  Constitutional: Negative.  Negative for chills and fever.  HENT: Positive for congestion and ear pain. Negative for sore throat.   Respiratory: Negative.  Negative for cough and shortness of breath.   Cardiovascular: Negative.  Negative for chest pain and palpitations.  Gastrointestinal: Negative.  Negative for abdominal pain, diarrhea, nausea and vomiting.  Genitourinary: Negative.   Musculoskeletal: Negative.   Skin:  Negative.  Negative for rash.  Neurological: Negative for dizziness and headaches.  All other systems reviewed and are negative.  Today's Vitals   04/02/19 1553  BP: 132/83  Pulse: 82  Resp: 16  Temp: 98.1 F (36.7 C)  TempSrc: Oral  SpO2: 95%  Weight: (!) 301 lb 6.4 oz (136.7 kg)  Height: 6' (1.829 m)   Body mass index is 40.88 kg/m.   Physical Exam Vitals signs reviewed.  Constitutional:      Appearance: Normal appearance.  HENT:     Head: Normocephalic.     Right Ear: Tympanic membrane and external ear normal. Swelling and tenderness present.     Left Ear: Tympanic membrane and external ear normal. Swelling and tenderness present.  Eyes:     Extraocular Movements: Extraocular movements intact.     Conjunctiva/sclera: Conjunctivae normal.     Pupils: Pupils are equal, round, and reactive to light.  Neck:     Musculoskeletal: Normal range of motion and neck supple.  Cardiovascular:     Rate and Rhythm: Normal rate and regular rhythm.  Pulmonary:     Effort: Pulmonary effort is normal.     Breath sounds: Normal breath sounds.  Musculoskeletal: Normal range of motion.  Skin:    General: Skin is warm and dry.     Capillary Refill: Capillary refill takes less than 2 seconds.  Neurological:     General: No focal deficit present.     Mental Status: He is alert and oriented to person, place, and time.  Psychiatric:        Mood and Affect: Mood normal.        Behavior: Behavior normal.      ASSESSMENT & PLAN: Stephen Hansen was seen today for ear problem.  Diagnoses and all orders for this visit:  Acute otalgia, bilateral  Infective otitis externa of both ears -     amoxicillin-clavulanate (AUGMENTIN) 875-125 MG tablet; Take 1 tablet by mouth 2 (two) times daily for 7 days. -     acetic acid-hydrocortisone (VOSOL-HC) OTIC solution; Place 3 drops into the left ear 3 (three) times daily.    Patient Instructions       If you have lab work done today you will be  contacted with your lab results within the next 2 weeks.  If you have not heard from us then please contact us. The fastest way to get your results is to register for My Chart.   IF you received an x-ray today, you will receive an invoice from [facility]. Please contact [facility] at [phone] with questions or concerns regarding your invoice.  IF you received labwork today, you will receive an invoice from [place]. Please contact [facility] at [phone] with questions or concerns regarding your invoice.   Our billing staff will not be able to assist you with questions regarding bills from these companies.  You will be contacted with the lab results as soon as they are available. The fastest way to get your results is to activate your My Chart account. Instructions are located on the last page of this paperwork. If you have not heard from [place] regarding the results in 2 weeks, please contact this office.     Otitis Externa  Otitis externa is an infection of the outer ear canal. The outer ear canal is the area between the outside of the ear and the eardrum. Otitis externa is sometimes called swimmer's ear. What are the causes? Common causes of this condition include:  Swimming in dirty water.  Moisture in the ear.  An injury to the inside of the ear.  An object stuck in the ear.  A cut or scrape on the outside of the ear. What increases the risk? You are more likely to get this condition if you go swimming often. What are the signs or symptoms?  Itching in the ear. This is often the first symptom.  Swelling of the ear.  Redness in the ear.  Ear pain. The pain may get worse when you pull on your ear.  Pus coming from the ear. How is this treated? This condition may be treated with:  Antibiotic ear drops. These are often given for 10-14 days.  Medicines to reduce itching and swelling. Follow these instructions at home:  If you were given  antibiotic ear drops, use them as told by your doctor. Do not stop using them even if your condition gets better.  Take over-the-counter and prescription medicines only as told by your doctor.  Avoid getting water in your ears as told by your doctor. You may be told to avoid swimming or water sports for a few days.  Keep all follow-up visits as told by your doctor. This is important. How is this prevented?  Keep your ears dry. Use the corner of a towel to dry your ears after you swim or bathe.  Try not to scratch or put things in your ear. Doing these things makes it easier for germs to grow in your ear.  Avoid swimming in lakes, dirty water, or pools that may not have the right amount of a chemical called chlorine. Contact a doctor if:  You have a fever.  Your ear is still red, swollen, or painful after 3 days.  You still have pus coming from your ear after 3 days.  Your redness, swelling, or pain gets worse.  You have a really bad headache.  You have redness, swelling, pain, or tenderness behind your ear. Summary  Otitis externa is an infection of the outer ear canal.  Symptoms include pain, redness, and swelling of the ear.  If you were given antibiotic ear drops, use them as told by your doctor. Do not stop using them even if your condition gets better.  Try not to scratch or put things in your ear. This information is not intended to replace advice given to you by your health care provider. Make sure you discuss any questions you have with your health care provider. Document Released: 11/14/2007 Document Revised: 11/01/2017 Document Reviewed: 11/01/2017 Elsevier Patient Education  2020 [employer].      [place],  MD [facility]

## 2019-04-02 NOTE — Patient Instructions (Addendum)
   If you have lab work done today you will be contacted with your lab results within the next 2 weeks.  If you have not heard from us then please contact us. The fastest way to get your results is to register for My Chart.   IF you received an x-ray today, you will receive an invoice from [facility]. Please contact [facility] at [phone] with questions or concerns regarding your invoice.   IF you received labwork today, you will receive an invoice from [facility]. Please contact [facility] at [phone] with questions or concerns regarding your invoice.   Our billing staff will not be able to assist you with questions regarding bills from these companies.  You will be contacted with the lab results as soon as they are available. The fastest way to get your results is to activate your My Chart account. Instructions are located on the last page of this paperwork. If you have not heard from us regarding the results in 2 weeks, please contact this office.     Otitis Externa  Otitis externa is an infection of the outer ear canal. The outer ear canal is the area between the outside of the ear and the eardrum. Otitis externa is sometimes called swimmer's ear. What are the causes? Common causes of this condition include:  Swimming in dirty water.  Moisture in the ear.  An injury to the inside of the ear.  An object stuck in the ear.  A cut or scrape on the outside of the ear. What increases the risk? You are more likely to get this condition if you go swimming often. What are the signs or symptoms?  Itching in the ear. This is often the first symptom.  Swelling of the ear.  Redness in the ear.  Ear pain. The pain may get worse when you pull on your ear.  Pus coming from the ear. How is this treated? This condition may be treated with:  Antibiotic ear drops. These are often given for 10-14 days.  Medicines to reduce itching and swelling. Follow these  instructions at home:  If you were given antibiotic ear drops, use them as told by your doctor. Do not stop using them even if your condition gets better.  Take over-the-counter and prescription medicines only as told by your doctor.  Avoid getting water in your ears as told by your doctor. You may be told to avoid swimming or water sports for a few days.  Keep all follow-up visits as told by your doctor. This is important. How is this prevented?  Keep your ears dry. Use the corner of a towel to dry your ears after you swim or bathe.  Try not to scratch or put things in your ear. Doing these things makes it easier for germs to grow in your ear.  Avoid swimming in lakes, dirty water, or pools that may not have the right amount of a chemical called chlorine. Contact a doctor if:  You have a fever.  Your ear is still red, swollen, or painful after 3 days.  You still have pus coming from your ear after 3 days.  Your redness, swelling, or pain gets worse.  You have a really bad headache.  You have redness, swelling, pain, or tenderness behind your ear. Summary  Otitis externa is an infection of the outer ear canal.  Symptoms include pain, redness, and swelling of the ear.  If you were given antibiotic ear drops, use them   as told by your doctor. Do not stop using them even if your condition gets better.  Try not to scratch or put things in your ear. This information is not intended to replace advice given to you by your health care provider. Make sure you discuss any questions you have with your health care provider. Document Released: 11/14/2007 Document Revised: 11/01/2017 Document Reviewed: 11/01/2017 Elsevier Patient Education  2020 [employer] Inc.  

## 2019-07-02 ENCOUNTER — Other Ambulatory Visit: Payer: Self-pay

## 2019-07-02 ENCOUNTER — Encounter: Payer: Self-pay | Admitting: Emergency Medicine

## 2019-07-02 ENCOUNTER — Ambulatory Visit: Payer: Federal, State, Local not specified - PPO | Admitting: Emergency Medicine

## 2019-07-02 VITALS — BP 126/84 | HR 64 | Temp 97.6°F | Resp 16 | Ht 71.0 in | Wt 313.0 lb

## 2019-07-02 DIAGNOSIS — G4700 Insomnia, unspecified: Secondary | ICD-10-CM | POA: Diagnosis not present

## 2019-07-02 DIAGNOSIS — Z23 Encounter for immunization: Secondary | ICD-10-CM | POA: Diagnosis not present

## 2019-07-02 MED ORDER — HYDROXYZINE HCL 25 MG PO TABS: 25.0000 mg | ORAL_TABLET | Freq: Every evening | ORAL | 1 refills | Status: DC | PRN

## 2019-07-02 NOTE — Addendum Note (Signed)
Addended by: Georg Ruddle A on: 07/02/2019 11:59 AM   Modules accepted: Orders

## 2019-07-02 NOTE — Progress Notes (Signed)
Stephen Hansen 29 y.o.   Chief Complaint  Patient presents with  . Insomnia    per patient trouble sleeping for 9 months    HISTORY OF PRESENT ILLNESS: This is a 29 y.o. male complaining of erratic sleep for the past 9 months.  Patient works at the [employer], night shifts, 5 PM to 5:30 in the morning.  Same gel for the past several years, used to work 5 PM to 1 AM, working extended hours given situation. No other complaints or medical concerns. No chronic medical problems.  HPI   Prior to Admission medications   Medication Sig Start Date End Date Taking? Authorizing Provider  Multiple Vitamin (MULTIVITAMIN) tablet Take 1 tablet by mouth daily.   Yes [provider]  acetic acid-hydrocortisone (VOSOL-HC) OTIC solution Place 3 drops into the left ear 3 (three) times daily. 04/02/19   Georgina Quint, MD  meloxicam (MOBIC) 15 MG tablet Take 1 tablet (15 mg total) by mouth daily. Patient not taking: Reported on 04/02/2019 08/18/16   Trena Platt D, PA  naproxen (NAPROSYN) 500 MG tablet Take 1 tablet (500 mg total) by mouth 2 (two) times daily with a meal. Patient not taking: Reported on 04/02/2019 06/15/16   Bing Neighbors, FNP  omeprazole (PRILOSEC) 40 MG capsule Take 1 capsule (40 mg total) by mouth daily. Patient not taking: Reported on 04/02/2019 06/20/16   Tobey Grim, MD  predniSONE (DELTASONE) 20 MG tablet Take 3 PO QAM x2days, 2 PO QAM x2days, 1 PO QAM x2days Patient not taking: Reported on 04/02/2019 08/18/16   Trena Platt D, PA  valACYclovir (VALTREX) 1000 MG tablet Take 1 tablet (1,000 mg total) by mouth 2 (two) times daily. Patient not taking: Reported on 04/02/2019 01/11/16   Shaune Pollack, MD    No Known Allergies  There are no problems to display for this patient.   Past Medical History:  Diagnosis Date  . Allergy     History reviewed. No pertinent surgical history.  Social History   Socioeconomic History  . Marital  status: Single    Spouse name: Not on file  . Number of children: Not on file  . Years of education: Not on file  . Highest education level: Not on file  Occupational History  . Not on file  Tobacco Use  . Smoking status: Never Smoker  . Smokeless tobacco: Never Used  Substance and Sexual Activity  . Alcohol use: Yes    Alcohol/week: 3.0 standard drinks    Types: 3 Cans of beer per week  . Drug use: No  . Sexual activity: Not on file  Other Topics Concern  . Not on file  Social History Narrative  . Not on file   Social Determinants of Health   Financial Resource Strain:   . Difficulty of Paying Living Expenses: Not on file  Food Insecurity:   . Worried About Programme researcher, broadcasting/film/video in the Last Year: Not on file  . Ran Out of Food in the Last Year: Not on file  Transportation Needs:   . Lack of Transportation (Medical): Not on file  . Lack of Transportation (Non-Medical): Not on file  Physical Activity:   . Days of Exercise per Week: Not on file  . Minutes of Exercise per Session: Not on file  Stress:   . Feeling of Stress : Not on file  Social Connections:   . Frequency of Communication with Friends and Family: Not on file  . Frequency of  Social Gatherings with Friends and Family: Not on file  . Attends Religious Services: Not on file  . Active Member of Clubs or Organizations: Not on file  . Attends Banker Meetings: Not on file  . Marital Status: Not on file  Intimate Partner Violence:   . Fear of Current or Ex-Partner: Not on file  . Emotionally Abused: Not on file  . Physically Abused: Not on file  . Sexually Abused: Not on file    Family History  Problem Relation Age of Onset  . Hyperlipidemia Mother   . Hypertension Mother   . Diabetes Maternal Grandmother   . Heart disease Maternal Grandmother   . Hyperlipidemia Maternal Grandmother   . Hypertension Maternal Grandmother   . Cancer Maternal Grandfather   . Hyperlipidemia Maternal Grandfather     . Mental illness Maternal Grandfather   . Diabetes Paternal Grandmother   . Hyperlipidemia Paternal Grandmother   . Cancer Paternal Grandfather   . Hyperlipidemia Paternal Grandfather      Review of Systems  Constitutional: Negative.  Negative for chills and fever.  HENT: Negative.  Negative for congestion and sore throat.   Respiratory: Negative.  Negative for cough and shortness of breath.   Cardiovascular: Negative.  Negative for chest pain and palpitations.  Gastrointestinal: Negative.  Negative for abdominal pain, diarrhea, nausea and vomiting.  Genitourinary: Negative.   Musculoskeletal: Negative.  Negative for back pain, myalgias and neck pain.  Skin: Negative.  Negative for rash.  Neurological: Negative.  Negative for dizziness and headaches.  Psychiatric/Behavioral: The patient has insomnia.   All other systems reviewed and are negative.  Today's Vitals   07/02/19 1119  BP: 126/84  Pulse: 64  Resp: 16  Temp: 97.6 F (36.4 C)  TempSrc: Temporal  SpO2: 95%  Weight: (!) 313 lb (142 kg)  Height: 5\' 11"  (1.803 m)   Body mass index is 43.65 kg/m.   Physical Exam Vitals reviewed.  Constitutional:      Appearance: Normal appearance.  HENT:     Head: Normocephalic.  Eyes:     Extraocular Movements: Extraocular movements intact.     Pupils: Pupils are equal, round, and reactive to light.  Cardiovascular:     Rate and Rhythm: Normal rate.  Pulmonary:     Effort: Pulmonary effort is normal.  Musculoskeletal:        General: Normal range of motion.     Cervical back: Normal range of motion.  Skin:    General: Skin is warm and dry.     Capillary Refill: Capillary refill takes less than 2 seconds.  Neurological:     General: No focal deficit present.     Mental Status: He is alert and oriented to person, place, and time.  Psychiatric:        Mood and Affect: Mood normal.        Behavior: Behavior normal.      ASSESSMENT & PLAN: Jeriel was seen today  for insomnia.  Diagnoses and all orders for this visit:  Insomnia, unspecified type -     hydrOXYzine (ATARAX/VISTARIL) 25 MG tablet; Take 1 tablet (25 mg total) by mouth at bedtime as needed.    Patient Instructions       If you have lab work done today you will be contacted with your lab results within the next 2 weeks.  If you have not heard from [facility] then please contact us. The fastest way to get your results is to register  for My Chart.   IF you received an x-ray today, you will receive an invoice from [facility]. Please contact [facility] at [phone] with questions or concerns regarding your invoice.   IF you received labwork today, you will receive an invoice from [facility]. Please contact [facility] at [phone] with questions or concerns regarding your invoice.   Our billing staff will not be able to assist you with questions regarding bills from these companies.  You will be contacted with the lab results as soon as they are available. The fastest way to get your results is to activate your My Chart account. Instructions are located on the last page of this paperwork. If you have not heard from [place] regarding the results in 2 weeks, please contact this office.     Insomnia Insomnia is a sleep disorder that makes it difficult to fall asleep or stay asleep. Insomnia can cause fatigue, low energy, difficulty concentrating, mood swings, and poor performance at work or school. There are three different ways to classify insomnia:  Difficulty falling asleep.  Difficulty staying asleep.  Waking up too early in the morning. Any type of insomnia can be long-term (chronic) or short-term (acute). Both are common. Short-term insomnia usually lasts for three months or less. Chronic insomnia occurs at least three times a week for longer than three months. What are the causes? Insomnia may be caused by another condition, situation, or substance, such  as:  Anxiety.  Certain medicines.  Gastroesophageal reflux disease (GERD) or other gastrointestinal conditions.  Asthma or other breathing conditions.  Restless legs syndrome, sleep apnea, or other sleep disorders.  Chronic pain.  Menopause.  Stroke.  Abuse of alcohol, tobacco, or illegal drugs.  Mental health conditions, such as depression.  Caffeine.  Neurological disorders, such as Alzheimer's disease.  An overactive thyroid (hyperthyroidism). Sometimes, the cause of insomnia may not be known. What increases the risk? Risk factors for insomnia include:  Gender. Women are affected more often than men.  Age. Insomnia is more common as you get older.  Stress.  Lack of exercise.  Irregular work schedule or working night shifts.  Traveling between different time zones.  Certain medical and mental health conditions. What are the signs or symptoms? If you have insomnia, the main symptom is having trouble falling asleep or having trouble staying asleep. This may lead to other symptoms, such as:  Feeling fatigued or having low energy.  Feeling nervous about going to sleep.  Not feeling rested in the morning.  Having trouble concentrating.  Feeling irritable, anxious, or depressed. How is this diagnosed? This condition may be diagnosed based on:  Your symptoms and medical history. Your health care provider may ask about: ? Your sleep habits. ? Any medical conditions you have. ? Your mental health.  A physical exam. How is this treated? Treatment for insomnia depends on the cause. Treatment may focus on treating an underlying condition that is causing insomnia. Treatment may also include:  Medicines to help you sleep.  Counseling or therapy.  Lifestyle adjustments to help you sleep better. Follow these instructions at home: Eating and drinking   Limit or avoid alcohol, caffeinated beverages, and cigarettes, especially close to bedtime. These can  disrupt your sleep.  Do not eat a large meal or eat spicy foods right before bedtime. This can lead to digestive discomfort that can make it hard for you to sleep. Sleep habits   Keep a sleep diary to help you and your health care provider  figure out what could be causing your insomnia. Write down: ? When you sleep. ? When you wake up during the night. ? How well you sleep. ? How rested you feel the next day. ? Any side effects of medicines you are taking. ? What you eat and drink.  Make your bedroom a dark, comfortable place where it is easy to fall asleep. ? Put up shades or blackout curtains to block light from outside. ? Use a white noise machine to block noise. ? Keep the temperature cool.  Limit screen use before bedtime. This includes: ? Watching TV. ? Using your smartphone, tablet, or computer.  Stick to a routine that includes going to bed and waking up at the same times every day and night. This can help you fall asleep faster. Consider making a quiet activity, such as reading, part of your nighttime routine.  Try to avoid taking naps during the day so that you sleep better at night.  Get out of bed if you are still awake after 15 minutes of trying to sleep. Keep the lights down, but try reading or doing a quiet activity. When you feel sleepy, go back to bed. General instructions  Take over-the-counter and prescription medicines only as told by your health care provider.  Exercise regularly, as told by your health care provider. Avoid exercise starting several hours before bedtime.  Use relaxation techniques to manage stress. Ask your health care provider to suggest some techniques that may work well for you. These may include: ? Breathing exercises. ? Routines to release muscle tension. ? Visualizing peaceful scenes.  Make sure that you drive carefully. Avoid driving if you feel very sleepy.  Keep all follow-up visits as told by your health care provider. This is  important. Contact a health care provider if:  You are tired throughout the day.  You have trouble in your daily routine due to sleepiness.  You continue to have sleep problems, or your sleep problems get worse. Get help right away if:  You have serious thoughts about hurting yourself or someone else. If you ever feel like you may hurt yourself or others, or have thoughts about taking your own life, get help right away. You can go to your nearest emergency department or call:  Your local emergency services (911 in the [place]  A suicide crisis helpline, such as the National Suicide Prevention Lifeline at 765-085-7988. This is open 24 hours a day. Summary  Insomnia is a sleep disorder that makes it difficult to fall asleep or stay asleep.  Insomnia can be long-term (chronic) or short-term (acute).  Treatment for insomnia depends on the cause. Treatment may focus on treating an underlying condition that is causing insomnia.  Keep a sleep diary to help you and your health care provider figure out what could be causing your insomnia. This information is not intended to replace advice given to you by your health care provider. Make sure you discuss any questions you have with your health care provider. Document Revised: 05/10/2017 Document Reviewed: 03/07/2017 Elsevier Patient Education  2020 Elsevier Inc.      Edwina Barth, MD [facility]

## 2019-07-02 NOTE — Patient Instructions (Addendum)
   If you have lab work done today you will be contacted with your lab results within the next 2 weeks.  If you have not heard from us then please contact us. The fastest way to get your results is to register for My Chart.   IF you received an x-ray today, you will receive an invoice from [facility]. Please contact [facility] at [phone] with questions or concerns regarding your invoice.   IF you received labwork today, you will receive an invoice from [facility]. Please contact [facility] at [phone] with questions or concerns regarding your invoice.   Our billing staff will not be able to assist you with questions regarding bills from these companies.  You will be contacted with the lab results as soon as they are available. The fastest way to get your results is to activate your My Chart account. Instructions are located on the last page of this paperwork. If you have not heard from us regarding the results in 2 weeks, please contact this office.     Insomnia Insomnia is a sleep disorder that makes it difficult to fall asleep or stay asleep. Insomnia can cause fatigue, low energy, difficulty concentrating, mood swings, and poor performance at work or school. There are three different ways to classify insomnia:  Difficulty falling asleep.  Difficulty staying asleep.  Waking up too early in the morning. Any type of insomnia can be long-term (chronic) or short-term (acute). Both are common. Short-term insomnia usually lasts for three months or less. Chronic insomnia occurs at least three times a week for longer than three months. What are the causes? Insomnia may be caused by another condition, situation, or substance, such as:  Anxiety.  Certain medicines.  Gastroesophageal reflux disease (GERD) or other gastrointestinal conditions.  Asthma or other breathing conditions.  Restless legs syndrome, sleep apnea, or other sleep disorders.  Chronic  pain.  Menopause.  Stroke.  Abuse of alcohol, tobacco, or illegal drugs.  Mental health conditions, such as depression.  Caffeine.  Neurological disorders, such as Alzheimer's disease.  An overactive thyroid (hyperthyroidism). Sometimes, the cause of insomnia may not be known. What increases the risk? Risk factors for insomnia include:  Gender. Women are affected more often than men.  Age. Insomnia is more common as you get older.  Stress.  Lack of exercise.  Irregular work schedule or working night shifts.  Traveling between different time zones.  Certain medical and mental health conditions. What are the signs or symptoms? If you have insomnia, the main symptom is having trouble falling asleep or having trouble staying asleep. This may lead to other symptoms, such as:  Feeling fatigued or having low energy.  Feeling nervous about going to sleep.  Not feeling rested in the morning.  Having trouble concentrating.  Feeling irritable, anxious, or depressed. How is this diagnosed? This condition may be diagnosed based on:  Your symptoms and medical history. Your health care provider may ask about: ? Your sleep habits. ? Any medical conditions you have. ? Your mental health.  A physical exam. How is this treated? Treatment for insomnia depends on the cause. Treatment may focus on treating an underlying condition that is causing insomnia. Treatment may also include:  Medicines to help you sleep.  Counseling or therapy.  Lifestyle adjustments to help you sleep better. Follow these instructions at home: Eating and drinking   Limit or avoid alcohol, caffeinated beverages, and cigarettes, especially close to bedtime. These can disrupt your sleep.    Do not eat a large meal or eat spicy foods right before bedtime. This can lead to digestive discomfort that can make it hard for you to sleep. Sleep habits   Keep a sleep diary to help you and your health care  provider figure out what could be causing your insomnia. Write down: ? When you sleep. ? When you wake up during the night. ? How well you sleep. ? How rested you feel the next day. ? Any side effects of medicines you are taking. ? What you eat and drink.  Make your bedroom a dark, comfortable place where it is easy to fall asleep. ? Put up shades or blackout curtains to block light from outside. ? Use a white noise machine to block noise. ? Keep the temperature cool.  Limit screen use before bedtime. This includes: ? Watching TV. ? Using your smartphone, tablet, or computer.  Stick to a routine that includes going to bed and waking up at the same times every day and night. This can help you fall asleep faster. Consider making a quiet activity, such as reading, part of your nighttime routine.  Try to avoid taking naps during the day so that you sleep better at night.  Get out of bed if you are still awake after 15 minutes of trying to sleep. Keep the lights down, but try reading or doing a quiet activity. When you feel sleepy, go back to bed. General instructions  Take over-the-counter and prescription medicines only as told by your health care provider.  Exercise regularly, as told by your health care provider. Avoid exercise starting several hours before bedtime.  Use relaxation techniques to manage stress. Ask your health care provider to suggest some techniques that may work well for you. These may include: ? Breathing exercises. ? Routines to release muscle tension. ? Visualizing peaceful scenes.  Make sure that you drive carefully. Avoid driving if you feel very sleepy.  Keep all follow-up visits as told by your health care provider. This is important. Contact a health care provider if:  You are tired throughout the day.  You have trouble in your daily routine due to sleepiness.  You continue to have sleep problems, or your sleep problems get worse. Get help right  away if:  You have serious thoughts about hurting yourself or someone else. If you ever feel like you may hurt yourself or others, or have thoughts about taking your own life, get help right away. You can go to your nearest emergency department or call:  Your local emergency services (911 in the [place]  A suicide crisis helpline, such as the National Suicide Prevention Lifeline at 1-800-273-8255. This is open 24 hours a day. Summary  Insomnia is a sleep disorder that makes it difficult to fall asleep or stay asleep.  Insomnia can be long-term (chronic) or short-term (acute).  Treatment for insomnia depends on the cause. Treatment may focus on treating an underlying condition that is causing insomnia.  Keep a sleep diary to help you and your health care provider figure out what could be causing your insomnia. This information is not intended to replace advice given to you by your health care provider. Make sure you discuss any questions you have with your health care provider. Document Revised: 05/10/2017 Document Reviewed: 03/07/2017 Elsevier Patient Education  2020 [employer]vier Inc.  

## 2019-08-22 ENCOUNTER — Encounter (HOSPITAL_COMMUNITY): Payer: Self-pay | Admitting: *Deleted

## 2019-08-22 ENCOUNTER — Other Ambulatory Visit: Payer: Self-pay

## 2019-08-22 ENCOUNTER — Emergency Department (HOSPITAL_COMMUNITY)
Admission: EM | Admit: 2019-08-22 | Discharge: 2019-08-23 | Disposition: A | Discharge status: Home / Self Care | Payer: Federal, State, Local not specified - PPO | Attending: Emergency Medicine | Admitting: Emergency Medicine

## 2019-08-22 DIAGNOSIS — R52 Pain, unspecified: Secondary | ICD-10-CM | POA: Diagnosis not present

## 2019-08-22 DIAGNOSIS — Z5321 Procedure and treatment not carried out due to patient leaving prior to being seen by health care provider: Secondary | ICD-10-CM | POA: Diagnosis not present

## 2019-08-22 NOTE — ED Triage Notes (Signed)
The pt has a cold generalized body aches headache temp unknown  All for one week  Has not been around covid

## 2019-08-23 NOTE — ED Notes (Signed)
Called with no answer x 1

## 2019-11-04 ENCOUNTER — Encounter: Payer: Federal, State, Local not specified - PPO | Admitting: Registered Nurse

## 2019-11-06 ENCOUNTER — Encounter: Payer: Self-pay | Admitting: Registered Nurse

## 2019-11-19 DIAGNOSIS — M79673 Pain in unspecified foot: Secondary | ICD-10-CM | POA: Diagnosis not present

## 2019-11-19 DIAGNOSIS — M722 Plantar fascial fibromatosis: Secondary | ICD-10-CM | POA: Diagnosis not present

## 2019-12-26 ENCOUNTER — Encounter (HOSPITAL_COMMUNITY): Payer: Self-pay | Admitting: *Deleted

## 2019-12-26 ENCOUNTER — Other Ambulatory Visit: Payer: Self-pay

## 2019-12-26 ENCOUNTER — Emergency Department (HOSPITAL_COMMUNITY)
Admission: EM | Admit: 2019-12-26 | Discharge: 2019-12-26 | Disposition: A | Discharge status: Home / Self Care | Payer: Federal, State, Local not specified - PPO | Attending: Emergency Medicine | Admitting: Emergency Medicine

## 2019-12-26 DIAGNOSIS — B9789 Other viral agents as the cause of diseases classified elsewhere: Secondary | ICD-10-CM | POA: Diagnosis not present

## 2019-12-26 DIAGNOSIS — J029 Acute pharyngitis, unspecified: Secondary | ICD-10-CM | POA: Diagnosis not present

## 2019-12-26 DIAGNOSIS — R0981 Nasal congestion: Secondary | ICD-10-CM

## 2019-12-26 DIAGNOSIS — Z20822 Contact with and (suspected) exposure to covid-19: Secondary | ICD-10-CM | POA: Insufficient documentation

## 2019-12-26 DIAGNOSIS — J028 Acute pharyngitis due to other specified organisms: Secondary | ICD-10-CM | POA: Insufficient documentation

## 2019-12-26 DIAGNOSIS — R030 Elevated blood-pressure reading, without diagnosis of hypertension: Secondary | ICD-10-CM | POA: Insufficient documentation

## 2019-12-26 DIAGNOSIS — B349 Viral infection, unspecified: Secondary | ICD-10-CM | POA: Diagnosis not present

## 2019-12-26 DIAGNOSIS — R5383 Other fatigue: Secondary | ICD-10-CM | POA: Diagnosis not present

## 2019-12-26 DIAGNOSIS — R059 Cough, unspecified: Secondary | ICD-10-CM

## 2019-12-26 LAB — CBG MONITORING, ED: Glucose-Capillary: 87 mg/dL (ref 70–99)

## 2019-12-26 LAB — GROUP A STREP BY PCR: Group A Strep by PCR: NOT DETECTED

## 2019-12-26 LAB — SARS CORONAVIRUS 2 BY RT PCR (HOSPITAL ORDER, PERFORMED IN ~~LOC~~ HOSPITAL LAB): SARS Coronavirus 2: NEGATIVE

## 2019-12-26 MED ORDER — ACETAMINOPHEN 500 MG PO TABS
1000.0000 mg | ORAL_TABLET | Freq: Once | ORAL | Status: AC
  Administered 2019-12-26: 1000 mg via ORAL
  Filled 2019-12-26: qty 2

## 2019-12-26 NOTE — ED Provider Notes (Signed)
[facility] EMERGENCY DEPARTMENT Provider Note   CSN: [id number] Arrival date & time: 12/26/19  1829     History Chief Complaint  Patient presents with  . sore throat    Stephen Hansen is a 29 y.o. male.  HPI  Patient is a 29 year old male with no pertinent past medical history presented today with complaints of 2 days of sore throat, headache, body aches, some shortness of breath intermittently but none now.  He denies any fevers or chills.  States that he noticed today that he has no sense of taste or smell.  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  He has no history of diabetes but notes he has been peeing more frequently for the past couple days.  He states he has been trying to drink more water as well because he has been feeling ill.  Denies any nausea, vomiting, abdominal pain, chest pain.  He states he took a small amount of ibuprofen but is uncertain how much he took.  He states that this was last taken yesterday.     Past Medical History:  Diagnosis Date  . Allergy     There are no problems to display for this patient.   History reviewed. No pertinent surgical history.     Family History  Problem Relation Age of Onset  . Hyperlipidemia Mother   . Hypertension Mother   . Diabetes Maternal Grandmother   . Heart disease Maternal Grandmother   . Hyperlipidemia Maternal Grandmother   . Hypertension Maternal Grandmother   . Cancer Maternal Grandfather   . Hyperlipidemia Maternal Grandfather   . Mental illness Maternal Grandfather   . Diabetes Paternal Grandmother   . Hyperlipidemia Paternal Grandmother   . Cancer Paternal Grandfather   . Hyperlipidemia Paternal Grandfather     Social History   Tobacco Use  . Smoking status: Never Smoker  . Smokeless tobacco: Never Used  Substance Use Topics  . Alcohol use: Yes    Alcohol/week: 3.0 standard drinks      Types: 3 Cans of beer per week  . Drug use: No    Home Medications Prior to Admission medications   Medication Sig Start Date End Date Taking? Authorizing Provider  acetic acid-hydrocortisone (VOSOL-HC) OTIC solution Place 3 drops into the left ear 3 (three) times daily. 04/02/19   Georgina Quint, MD  hydrOXYzine (ATARAX/VISTARIL) 25 MG tablet Take 1 tablet (25 mg total) by mouth at bedtime as needed. 07/02/19   Georgina Quint, MD  meloxicam (MOBIC) 15 MG tablet Take 1 tablet (15 mg total) by mouth daily. Patient not taking: Reported on 04/02/2019 08/18/16   Trena Platt D, PA  Multiple Vitamin (MULTIVITAMIN) tablet Take 1 tablet by mouth daily.    [provider]  naproxen (NAPROSYN) 500 MG tablet Take 1 tablet (500 mg total) by mouth 2 (two) times daily with a meal. Patient not taking: Reported on 04/02/2019 06/15/16   Bing Neighbors, FNP  omeprazole (PRILOSEC) 40 MG capsule Take 1 capsule (40 mg total) by mouth daily. Patient not taking: Reported on 04/02/2019 06/20/16   Tobey Grim, MD  predniSONE (DELTASONE) 20 MG tablet Take 3 PO QAM x2days, 2 PO QAM x2days, 1 PO QAM x2days Patient not taking: Reported on 04/02/2019 08/18/16   Trena Platt D, PA  valACYclovir (VALTREX) 1000 MG tablet Take 1 tablet (1,000 mg total) by  mouth 2 (two) times daily. Patient not taking: Reported on 04/02/2019 01/11/16   Shaune Pollack, MD    Allergies    Patient has no known allergies.  Review of Systems   Review of Systems  Constitutional: Positive for appetite change and fatigue. Negative for chills and fever.  HENT: Positive for congestion and sore throat.   Respiratory: Positive for cough. Negative for shortness of breath.   Cardiovascular: Negative for chest pain.  Gastrointestinal: Negative for abdominal pain, diarrhea, nausea and vomiting.  Musculoskeletal: Negative for neck pain.    Physical Exam Updated Vital Signs BP (!) 149/99 (BP Location:  Right Arm)   Pulse 76   Temp 98.3 F (36.8 C) (Oral)   Resp 16   Ht 5\' 11"  (1.803 m)   Wt (!) 140.6 kg   SpO2 99%   BMI 43.24 kg/m   Physical Exam Vitals and nursing note reviewed.  Constitutional:      General: He is not in acute distress.    Appearance: He is obese.  HENT:     Head: Normocephalic and atraumatic.     Right Ear: Tympanic membrane, ear canal and external ear normal.     Left Ear: Tympanic membrane, ear canal and external ear normal.     Nose: Nose normal. No congestion.     Mouth/Throat:     Mouth: Mucous membranes are moist.     Pharynx: No oropharyngeal exudate or posterior oropharyngeal erythema.     Comments: There is mild posterior pharynx clear nasal drainage.  No erythema mild cobblestoning present. Eyes:     General: No scleral icterus.       Right eye: No discharge.        Left eye: No discharge.     Conjunctiva/sclera: Conjunctivae normal.  Cardiovascular:     Rate and Rhythm: Normal rate and regular rhythm.     Pulses: Normal pulses.     Heart sounds: Normal heart sounds.  Pulmonary:     Effort: Pulmonary effort is normal. No respiratory distress.     Breath sounds: Normal breath sounds. No wheezing.     Comments: Normal effort of breathing.  Speaking full sentences.  Normal breath sounds. Abdominal:     Palpations: Abdomen is soft.     Tenderness: There is no abdominal tenderness.  Musculoskeletal:     Cervical back: Normal range of motion.     Right lower leg: No edema.     Left lower leg: No edema.  Lymphadenopathy:     Cervical: No cervical adenopathy.  Skin:    General: Skin is warm and dry.     Capillary Refill: Capillary refill takes less than 2 seconds.  Neurological:     Mental Status: He is alert. Mental status is at baseline.  Psychiatric:        Mood and Affect: Mood normal.        Behavior: Behavior normal.     ED Results / Procedures / Treatments   Labs (all labs ordered are listed, but only abnormal results are  displayed) Labs Reviewed  GROUP A STREP BY PCR  SARS CORONAVIRUS 2 BY RT PCR (HOSPITAL ORDER, PERFORMED IN [place] [facility])  CBG MONITORING, ED    EKG None  Radiology No results found.  Procedures Procedures (including critical care time)  Medications Ordered in ED Medications  acetaminophen (TYLENOL) tablet 1,000 mg (1,000 mg Oral Given 12/26/19 2209)    ED Course  I have reviewed the triage vital  signs and the nursing notes.  Pertinent labs & imaging results that were available during my care of the patient were reviewed by me and considered in my medical decision making (see chart for details).    MDM Rules/Calculators/A&P                          Patient is 29 year old male presented today with symptoms described in HPI.  He has no significant past medical history apart from allergies.  He has taken no medications prior to arrival for his symptoms.  He is complaining of congestion, cough, fatigue, decrease in smell and taste.  He has known Covid exposure and endorses some fevers although he is afebrile here.  Physical exam is unremarkable.  Patient is well-appearing.  CBG within normal limits was obtained because of patient's frequent urination.  He has no abdominal pain I have low suspicion for DKA and his CBG is within normal limits.  Likely this is because of his increased drinking water.  Covid pending at this time discharged with work note and information on Covid should it prove positive.  He is not hypoxic and lung sounds are normal.  He is well-appearing.  His blood pressure is elevated he will follow-up with PCP for this.  These are negative for strep throat.  Patient is tolerating p.o., ambulatory, not dyspneic.  Will discharge with close follow-up with PCP.  He will return to ED for any new or concerning symptoms and is aware of plan if he is cleared with positive.  Stephen Hansen was evaluated in Emergency Department on 12/26/2019 for the symptoms  described in the history of present illness. He was evaluated in the context of the global COVID-19 pandemic, which necessitated consideration that the patient might be at risk for infection with the SARS-CoV-2 virus that causes COVID-19. Institutional protocols and algorithms that pertain to the evaluation of patients at risk for COVID-19 are in a state of rapid change based on information released by regulatory bodies including the CDC and federal and state organizations. These policies and algorithms were followed during the patient's care in the ED.  Final Clinical Impression(s) / ED Diagnoses Final diagnoses:  Cough  Congestion of nasal sinus  Other fatigue  Pharyngitis with viral syndrome  Elevated blood pressure reading    Rx / DC Orders ED Discharge Orders    None       Gailen Shelter, [place] 12/26/19 2233    Charlynne Pander, MD 12/27/19 2017

## 2019-12-26 NOTE — ED Triage Notes (Signed)
The pt  Has had a sore throat headache  And he reports that he has had  Some sob since yesterday  No sob now   No fever. The pt thinks he may have the covid since he could not taste the vanilla ice cream he was eating earlier today.   No distress

## 2019-12-26 NOTE — Discharge Instructions (Addendum)
Your COVID test is pending; you should expect results in 2-3 days. You can access your results on your MyChart--if you test positive you should receive a phone call.  In the meantime follow CDC guidelines and quarantine, wear a mask, wash hands often.    Please use Tylenol or ibuprofen for pain.  You may use 600 mg ibuprofen every 6 hours or 1000 mg of Tylenol every 6 hours.  You may choose to alternate between the 2.  This would be most effective.  Not to exceed 4 g of Tylenol within 24 hours.  Not to exceed 3200 mg ibuprofen 24 hours.   Please take over the counter vitamin D 2000-4000 units per day. I also recommend zinc 50 mg per day for the next two weeks.   Please return to ED if you feel have difficulty breathing or have emergent, new or concerning symptoms.  Patients who have symptoms consistent with COVID-19 should self isolated for: At least 3 days (72 hours) have passed since recovery, defined as resolution of fever without the use of fever reducing medications and improvement in respiratory symptoms (e.g., cough, shortness of breath), and At least 7 days have passed since symptoms first appeared.       Person Under Monitoring Name: Stephen Hansen  Location: [address] [place] [address]   Infection Prevention Recommendations for Individuals Confirmed to have, or Being Evaluated for, 2019 Novel Coronavirus (COVID-19) Infection Who Receive Care at Home  Individuals who are confirmed to have, or are being evaluated for, COVID-19 should follow the prevention steps below until a healthcare provider or local or state health department says they can return to normal activities.  Stay home except to get medical care You should restrict activities outside your home, except for getting medical care. Do not go to work, school, or public areas, and do not use public transportation or taxis.  Call ahead before visiting your doctor Before your medical  appointment, call the healthcare provider and tell them that you have, or are being evaluated for, COVID-19 infection. This will help the healthcare provider's office take steps to keep other people from getting infected. Ask your healthcare provider to call the local or state health department.  Monitor your symptoms Seek prompt medical attention if your illness is worsening (e.g., difficulty breathing). Before going to your medical appointment, call the healthcare provider and tell them that you have, or are being evaluated for, COVID-19 infection. Ask your healthcare provider to call the local or state health department.  Wear a facemask You should wear a facemask that covers your nose and mouth when you are in the same room with other people and when you visit a healthcare provider. People who live with or visit you should also wear a facemask while they are in the same room with you.  Separate yourself from other people in your home As much as possible, you should stay in a different room from other people in your home. Also, you should use a separate bathroom, if available.  Avoid sharing household items You should not share dishes, drinking glasses, cups, eating utensils, towels, bedding, or other items with other people in your home. After using these items, you should wash them thoroughly with soap and water.  Cover your coughs and sneezes Cover your mouth and nose with a tissue when you cough or sneeze, or you can cough or sneeze into your sleeve. Throw used tissues in a lined trash can, and immediately  wash your hands with soap and water for at least 20 seconds or use an alcohol-based hand rub.  Wash your [employer] your hands often and thoroughly with soap and water for at least 20 seconds. You can use an alcohol-based hand sanitizer if soap and water are not available and if your hands are not visibly dirty. Avoid touching your eyes, nose, and mouth with unwashed  hands.   Prevention Steps for Caregivers and Household Members of Individuals Confirmed to have, or Being Evaluated for, COVID-19 Infection Being Cared for in the Home  If you live with, or provide care at home for, a person confirmed to have, or being evaluated for, COVID-19 infection please follow these guidelines to prevent infection:  Follow healthcare provider's instructions Make sure that you understand and can help the patient follow any healthcare provider instructions for all care.  Provide for the patient's basic needs You should help the patient with basic needs in the home and provide support for getting groceries, prescriptions, and other personal needs.  Monitor the patient's symptoms If they are getting sicker, call his or her medical provider and tell them that the patient has, or is being evaluated for, COVID-19 infection. This will help the healthcare provider's office take steps to keep other people from getting infected. Ask the healthcare provider to call the local or state health department.  Limit the number of people who have contact with the patient If possible, have only one caregiver for the patient. Other household members should stay in another home or place of residence. If this is not possible, they should stay in another room, or be separated from the patient as much as possible. Use a separate bathroom, if available. Restrict visitors who do not have an essential need to be in the home.  Keep older adults, very young children, and other sick people away from the patient Keep older adults, very young children, and those who have compromised immune systems or chronic health conditions away from the patient. This includes people with chronic heart, lung, or kidney conditions, diabetes, and cancer.  Ensure good ventilation Make sure that shared spaces in the home have good air flow, such as from an air conditioner or an opened window, weather  permitting.  Wash your hands often Wash your hands often and thoroughly with soap and water for at least 20 seconds. You can use an alcohol based hand sanitizer if soap and water are not available and if your hands are not visibly dirty. Avoid touching your eyes, nose, and mouth with unwashed hands. Use disposable paper towels to dry your hands. If not available, use dedicated cloth towels and replace them when they become wet.  Wear a facemask and gloves Wear a disposable facemask at all times in the room and gloves when you touch or have contact with the patient's blood, body fluids, and/or secretions or excretions, such as sweat, saliva, sputum, nasal mucus, vomit, urine, or feces.  Ensure the mask fits over your nose and mouth tightly, and do not touch it during use. Throw out disposable facemasks and gloves after using them. Do not reuse. Wash your hands immediately after removing your facemask and gloves. If your personal clothing becomes contaminated, carefully remove clothing and launder. Wash your hands after handling contaminated clothing. Place all used disposable facemasks, gloves, and other waste in a lined container before disposing them with other household waste. Remove gloves and wash your hands immediately after handling these items.  Do not share  dishes, glasses, or other household items with the patient Avoid sharing household items. You should not share dishes, drinking glasses, cups, eating utensils, towels, bedding, or other items with a patient who is confirmed to have, or being evaluated for, COVID-19 infection. After the person uses these items, you should wash them thoroughly with soap and water.  Wash laundry thoroughly Immediately remove and wash clothes or bedding that have blood, body fluids, and/or secretions or excretions, such as sweat, saliva, sputum, nasal mucus, vomit, urine, or feces, on them. Wear gloves when handling laundry from the patient. Read and  follow directions on labels of laundry or clothing items and detergent. In general, wash and dry with the warmest temperatures recommended on the label.  Clean all areas the individual has used often Clean all touchable surfaces, such as counters, tabletops, doorknobs, bathroom fixtures, toilets, phones, keyboards, tablets, and bedside tables, every day. Also, clean any surfaces that may have blood, body fluids, and/or secretions or excretions on them. Wear gloves when cleaning surfaces the patient has come in contact with. Use a diluted bleach solution (e.g., dilute bleach with 1 part bleach and 10 parts water) or a household disinfectant with a label that says EPA-registered for coronaviruses. To make a bleach solution at home, add 1 tablespoon of bleach to 1 quart (4 cups) of water. For a larger supply, add  cup of bleach to 1 gallon (16 cups) of water. Read labels of cleaning products and follow recommendations provided on product labels. Labels contain instructions for safe and effective use of the cleaning product including precautions you should take when applying the product, such as wearing gloves or eye protection and making sure you have good ventilation during use of the product. Remove gloves and wash hands immediately after cleaning.  Monitor yourself for signs and symptoms of illness Caregivers and household members are considered close contacts, should monitor their health, and will be asked to limit movement outside of the home to the extent possible. Follow the monitoring steps for close contacts listed on the symptom monitoring form.   ? If you have additional questions, contact your local health department or call the epidemiologist on call at [phone] (available 24/7). ? This guidance is subject to change. For the most up-to-date guidance from [facility], please refer to their website: [email or web address]

## 2019-12-29 ENCOUNTER — Ambulatory Visit: Payer: Self-pay | Admitting: *Deleted

## 2019-12-29 NOTE — Telephone Encounter (Signed)
Patient called to ask how long to quarantine from symptoms of covid and to see PCP due to symptoms are getting worse. Denies difficulty breathing and SOB at this time. Sore throat, some coughing.headache, feels "hot" as symptoms. Symptoms began 12/25/19 seen in ED and covid results were not detected. Instructed patient to quarantine for 10 days after symptoms began on 12/25/19. Isolation precautions reviewed. appt made with PCP. Care advise given. patient verbalized understanding of care advise and isolation precautions. Instructed patient to go to ED or urgent care if symptoms worsen.  Reason for Disposition . [1] PERSISTING SYMPTOMS OF COVID-19 AND [2] symptoms WORSE  Answer Assessment - Initial Assessment Questions 1. COVID-19 ONSET: "When did the symptoms of COVID-19 first start?"     12/25/19 cough, sore throat and loss of taste and smell. 2. DIAGNOSIS CONFIRMATION: "How were you diagnosed?" (e.g., COVID-19 oral or nasal viral test; COVID-19 antibody test; doctor visit)     Not detected in ED visit 12/25/19 3. MAIN SYMPTOM:  "What is your main concern or symptom right now?" (e.g., breathing difficulty, cough, fatigue. loss of smell)     Symptoms of headache, sore throat and some coughing worse. 4. SYMPTOM ONSET: "When did the flu like symptoms  start?"     Saturday 12/26/19 5. BETTER-SAME-WORSE: "Are you getting better, staying the same, or getting worse over the last 1 to 2 weeks?"     Worse. Now feeling "hot" unable to check temperature due to no thermometer available .  6. RECENT MEDICAL VISIT: "Have you been seen by a healthcare provider (doctor, NP, PA) for these persisting COVID-19 symptoms?" If Yes, ask: "When were you seen?" (e.g., date)     No . Last MD seen in ED per patient . PCP English. Seen by Alvy Bimler last. 7. COUGH: "Do you have a cough?" If Yes, ask: "How bad is the cough?"       Some cough. Hurts to cough 8. FEVER: "Do you have a fever?" If Yes, ask: "What is your temperature,  how was it measured, and when did it start?"     Feels "hot' unable to confirm temp. 9. BREATHING DIFFICULTY: "Are you having any trouble breathing?" If Yes, ask: "How bad is your breathing?" (e.g., mild, moderate, severe)    - MILD: No SOB at rest, mild SOB with walking, speaks normally in sentences, can lay down, no retractions, pulse < 100.    - MODERATE: SOB at rest, SOB with minimal exertion and prefers to sit, cannot lie down flat, speaks in phrases, mild retractions, audible wheezing, pulse 100-120.    - SEVERE: Very SOB at rest, speaks in single words, struggling to breathe, sitting hunched forward, retractions, pulse > 120       Mild difficulty breathing with exertion 10. HIGH RISK DISEASE: "Do you have any chronic medical problems?" (e.g., asthma, heart or lung disease, weak immune system, obesity, etc.)       Heart murmur as a child  12. OTHER SYMPTOMS: "Do you have any other symptoms?"  (e.g., fatigue, headache, muscle pain, weakness)     Sore throat , trouble focusing, tired urinating alot  Protocols used: CORONAVIRUS (COVID-19) PERSISTING SYMPTOMS FOLLOW-UP CALL-A-AH

## 2019-12-30 ENCOUNTER — Ambulatory Visit: Payer: Federal, State, Local not specified - PPO | Admitting: Emergency Medicine

## 2019-12-30 ENCOUNTER — Other Ambulatory Visit: Payer: Self-pay

## 2019-12-30 ENCOUNTER — Encounter: Payer: Self-pay | Admitting: Emergency Medicine

## 2019-12-30 VITALS — Ht 71.0 in | Wt 310.0 lb

## 2019-12-30 DIAGNOSIS — R6889 Other general symptoms and signs: Secondary | ICD-10-CM

## 2019-12-30 DIAGNOSIS — Z20822 Contact with and (suspected) exposure to covid-19: Secondary | ICD-10-CM | POA: Diagnosis not present

## 2019-12-30 NOTE — Patient Instructions (Signed)
° ° ° °  If you have lab work done today you will be contacted with your lab results within the next 2 weeks.  If you have not heard from us then please contact us. The fastest way to get your results is to register for My Chart. ° ° °IF you received an x-ray today, you will receive an invoice from [facility]. Please contact [facility] at [phone] with questions or concerns regarding your invoice.  ° °IF you received labwork today, you will receive an invoice from [facility]. Please contact [facility] at [phone] with questions or concerns regarding your invoice.  ° °Our billing staff will not be able to assist you with questions regarding bills from these companies. ° °You will be contacted with the lab results as soon as they are available. The fastest way to get your results is to activate your My Chart account. Instructions are located on the last page of this paperwork. If you have not heard from us regarding the results in 2 weeks, please contact this office. °  ° ° ° °

## 2019-12-30 NOTE — Progress Notes (Signed)
Telemedicine Encounter- SOAP NOTE Established Patient  This telephone encounter was conducted with the patient's (or proxy's) verbal consent via audio telecommunications: yes/no: Yes Patient was instructed to have this encounter in a suitably private space; and to only have persons present to whom they give permission to participate. In addition, patient identity was confirmed by use of name plus two identifiers (DOB and address).  I discussed the limitations, risks, security and privacy concerns of performing an evaluation and management service by telephone and the availability of in person appointments. I also discussed with the patient that there may be a patient responsible charge related to this service. The patient expressed understanding and agreed to proceed.  I spent a total of TIME; 0 MIN TO 60 MIN: 20 minutes talking with the patient or their proxy.  Chief Complaint  Patient presents with  . COVID symptoms    Pt stated that he has some COVID symptoms that started  12/26/2019. He has been having some fatigue, no tast, headache, chills and he feels like he has a tempature, and a sore throat.    Subjective   Stephen Hansen is a 29 y.o. male established patient. Telephone visit today complaining of flulike symptoms that started last Saturday 4 days ago.  Went to the emergency department.  Tested negative for Covid.  However hospital typical Covid symptoms.  Complaining of loss of taste, fever, chills, headaches, sinus congestion, weakness, and sore throat.  Denies difficulty breathing or chest pain.  Able to eat and drink.  Denies nausea or vomiting or diarrhea.  No other significant associated symptoms.  HPI   There are no problems to display for this patient.   Past Medical History:  Diagnosis Date  . Allergy     Current Outpatient Medications  Medication Sig Dispense Refill  . Multiple Vitamin (MULTIVITAMIN) tablet Take 1 tablet by mouth daily.     No current  facility-administered medications for this visit.    Allergies  Allergen Reactions  . Other     Social History   Socioeconomic History  . Marital status: Single    Spouse name: Not on file  . Number of children: Not on file  . Years of education: Not on file  . Highest education level: Not on file  Occupational History  . Not on file  Tobacco Use  . Smoking status: Never Smoker  . Smokeless tobacco: Never Used  Substance and Sexual Activity  . Alcohol use: Yes    Alcohol/week: 3.0 standard drinks    Types: 3 Cans of beer per week  . Drug use: No  . Sexual activity: Not on file  Other Topics Concern  . Not on file  Social History Narrative  . Not on file   Social Determinants of Health   Financial Resource Strain:   . Difficulty of Paying Living Expenses:   Food Insecurity:   . Worried About Programme researcher, broadcasting/film/video in the Last Year:   . Barista in the Last Year:   Transportation Needs:   . Freight forwarder (Medical):   Marland Kitchen Lack of Transportation (Non-Medical):   Physical Activity:   . Days of Exercise per Week:   . Minutes of Exercise per Session:   Stress:   . Feeling of Stress :   Social Connections:   . Frequency of Communication with Friends and Family:   . Frequency of Social Gatherings with Friends and Family:   . Attends Religious Services:   .  Active Member of Clubs or Organizations:   . Attends Banker Meetings:   Marland Kitchen Marital Status:   Intimate Partner Violence:   . Fear of Current or Ex-Partner:   . Emotionally Abused:   Marland Kitchen Physically Abused:   . Sexually Abused:     Review of Systems  Constitutional: Positive for fever and malaise/fatigue. Negative for chills.  HENT: Positive for congestion and sore throat.   Respiratory: Positive for cough. Negative for hemoptysis, sputum production, shortness of breath and wheezing.   Cardiovascular: Negative.  Negative for chest pain and palpitations.  Gastrointestinal: Negative for  abdominal pain, blood in stool, diarrhea, nausea and vomiting.  Genitourinary: Negative.  Negative for dysuria and hematuria.  Musculoskeletal: Positive for myalgias.  Skin: Negative for rash.  Neurological: Positive for headaches. Negative for dizziness.  All other systems reviewed and are negative.   Objective  Alert and oriented x3 no apparent respiratory distress Vitals as reported by the patient: Today's Vitals   12/30/19 1626  Weight: (!) 310 lb (140.6 kg)  Height: 5\' 11"  (1.803 m)    There are no diagnoses linked to this encounter. Baker was seen today for covid symptoms.  Diagnoses and all orders for this visit:  Flu-like symptoms  Suspected COVID-19 virus infection   Covid instructions given.  ED precautions given. Advised to contact the office if no better or worse in the next several days.  I discussed the assessment and treatment plan with the patient. The patient was provided an opportunity to ask questions and all were answered. The patient agreed with the plan and demonstrated an understanding of the instructions.   The patient was advised to call back or seek an in-person evaluation if the symptoms worsen or if the condition fails to improve as anticipated.  I provided 20 minutes of non-face-to-face time during this encounter.  Christiane Ha, MD  [facility] at [facility]

## 2019-12-30 NOTE — Telephone Encounter (Signed)
Noted  

## 2020-02-04 DIAGNOSIS — M9905 Segmental and somatic dysfunction of pelvic region: Secondary | ICD-10-CM | POA: Diagnosis not present

## 2020-02-04 DIAGNOSIS — M9903 Segmental and somatic dysfunction of lumbar region: Secondary | ICD-10-CM | POA: Diagnosis not present

## 2020-02-04 DIAGNOSIS — M5386 Other specified dorsopathies, lumbar region: Secondary | ICD-10-CM | POA: Diagnosis not present

## 2020-02-04 DIAGNOSIS — M25551 Pain in right hip: Secondary | ICD-10-CM | POA: Diagnosis not present

## 2020-02-10 DIAGNOSIS — M25551 Pain in right hip: Secondary | ICD-10-CM | POA: Diagnosis not present

## 2020-02-10 DIAGNOSIS — M5386 Other specified dorsopathies, lumbar region: Secondary | ICD-10-CM | POA: Diagnosis not present

## 2020-02-10 DIAGNOSIS — M9905 Segmental and somatic dysfunction of pelvic region: Secondary | ICD-10-CM | POA: Diagnosis not present

## 2020-02-10 DIAGNOSIS — M9903 Segmental and somatic dysfunction of lumbar region: Secondary | ICD-10-CM | POA: Diagnosis not present

## 2020-02-16 DIAGNOSIS — M9905 Segmental and somatic dysfunction of pelvic region: Secondary | ICD-10-CM | POA: Diagnosis not present

## 2020-02-16 DIAGNOSIS — M25551 Pain in right hip: Secondary | ICD-10-CM | POA: Diagnosis not present

## 2020-02-16 DIAGNOSIS — M5386 Other specified dorsopathies, lumbar region: Secondary | ICD-10-CM | POA: Diagnosis not present

## 2020-02-16 DIAGNOSIS — M9903 Segmental and somatic dysfunction of lumbar region: Secondary | ICD-10-CM | POA: Diagnosis not present

## 2020-02-17 DIAGNOSIS — M25551 Pain in right hip: Secondary | ICD-10-CM | POA: Diagnosis not present

## 2020-02-17 DIAGNOSIS — M5386 Other specified dorsopathies, lumbar region: Secondary | ICD-10-CM | POA: Diagnosis not present

## 2020-02-17 DIAGNOSIS — M9903 Segmental and somatic dysfunction of lumbar region: Secondary | ICD-10-CM | POA: Diagnosis not present

## 2020-02-17 DIAGNOSIS — M9905 Segmental and somatic dysfunction of pelvic region: Secondary | ICD-10-CM | POA: Diagnosis not present

## 2020-02-18 DIAGNOSIS — M25551 Pain in right hip: Secondary | ICD-10-CM | POA: Diagnosis not present

## 2020-02-18 DIAGNOSIS — M9905 Segmental and somatic dysfunction of pelvic region: Secondary | ICD-10-CM | POA: Diagnosis not present

## 2020-02-18 DIAGNOSIS — M9903 Segmental and somatic dysfunction of lumbar region: Secondary | ICD-10-CM | POA: Diagnosis not present

## 2020-02-18 DIAGNOSIS — M5386 Other specified dorsopathies, lumbar region: Secondary | ICD-10-CM | POA: Diagnosis not present

## 2020-02-22 DIAGNOSIS — M25551 Pain in right hip: Secondary | ICD-10-CM | POA: Diagnosis not present

## 2020-02-22 DIAGNOSIS — M9905 Segmental and somatic dysfunction of pelvic region: Secondary | ICD-10-CM | POA: Diagnosis not present

## 2020-02-22 DIAGNOSIS — M5386 Other specified dorsopathies, lumbar region: Secondary | ICD-10-CM | POA: Diagnosis not present

## 2020-02-22 DIAGNOSIS — M9903 Segmental and somatic dysfunction of lumbar region: Secondary | ICD-10-CM | POA: Diagnosis not present

## 2020-02-24 DIAGNOSIS — M9905 Segmental and somatic dysfunction of pelvic region: Secondary | ICD-10-CM | POA: Diagnosis not present

## 2020-02-24 DIAGNOSIS — M5386 Other specified dorsopathies, lumbar region: Secondary | ICD-10-CM | POA: Diagnosis not present

## 2020-02-24 DIAGNOSIS — M9903 Segmental and somatic dysfunction of lumbar region: Secondary | ICD-10-CM | POA: Diagnosis not present

## 2020-02-24 DIAGNOSIS — M25551 Pain in right hip: Secondary | ICD-10-CM | POA: Diagnosis not present

## 2020-02-29 DIAGNOSIS — M5386 Other specified dorsopathies, lumbar region: Secondary | ICD-10-CM | POA: Diagnosis not present

## 2020-02-29 DIAGNOSIS — M9903 Segmental and somatic dysfunction of lumbar region: Secondary | ICD-10-CM | POA: Diagnosis not present

## 2020-02-29 DIAGNOSIS — M9905 Segmental and somatic dysfunction of pelvic region: Secondary | ICD-10-CM | POA: Diagnosis not present

## 2020-02-29 DIAGNOSIS — M25551 Pain in right hip: Secondary | ICD-10-CM | POA: Diagnosis not present

## 2020-03-03 DIAGNOSIS — M25551 Pain in right hip: Secondary | ICD-10-CM | POA: Diagnosis not present

## 2020-03-03 DIAGNOSIS — M9905 Segmental and somatic dysfunction of pelvic region: Secondary | ICD-10-CM | POA: Diagnosis not present

## 2020-03-03 DIAGNOSIS — M9903 Segmental and somatic dysfunction of lumbar region: Secondary | ICD-10-CM | POA: Diagnosis not present

## 2020-03-03 DIAGNOSIS — M5386 Other specified dorsopathies, lumbar region: Secondary | ICD-10-CM | POA: Diagnosis not present

## 2020-03-07 DIAGNOSIS — M25551 Pain in right hip: Secondary | ICD-10-CM | POA: Diagnosis not present

## 2020-03-07 DIAGNOSIS — M5386 Other specified dorsopathies, lumbar region: Secondary | ICD-10-CM | POA: Diagnosis not present

## 2020-03-07 DIAGNOSIS — M9903 Segmental and somatic dysfunction of lumbar region: Secondary | ICD-10-CM | POA: Diagnosis not present

## 2020-03-07 DIAGNOSIS — M9905 Segmental and somatic dysfunction of pelvic region: Secondary | ICD-10-CM | POA: Diagnosis not present

## 2020-03-10 DIAGNOSIS — M25551 Pain in right hip: Secondary | ICD-10-CM | POA: Diagnosis not present

## 2020-03-10 DIAGNOSIS — M9903 Segmental and somatic dysfunction of lumbar region: Secondary | ICD-10-CM | POA: Diagnosis not present

## 2020-03-10 DIAGNOSIS — M9905 Segmental and somatic dysfunction of pelvic region: Secondary | ICD-10-CM | POA: Diagnosis not present

## 2020-03-10 DIAGNOSIS — M5386 Other specified dorsopathies, lumbar region: Secondary | ICD-10-CM | POA: Diagnosis not present

## 2020-03-14 DIAGNOSIS — M5386 Other specified dorsopathies, lumbar region: Secondary | ICD-10-CM | POA: Diagnosis not present

## 2020-03-14 DIAGNOSIS — M25551 Pain in right hip: Secondary | ICD-10-CM | POA: Diagnosis not present

## 2020-03-14 DIAGNOSIS — M9903 Segmental and somatic dysfunction of lumbar region: Secondary | ICD-10-CM | POA: Diagnosis not present

## 2020-03-14 DIAGNOSIS — M9905 Segmental and somatic dysfunction of pelvic region: Secondary | ICD-10-CM | POA: Diagnosis not present

## 2020-03-21 DIAGNOSIS — M25551 Pain in right hip: Secondary | ICD-10-CM | POA: Diagnosis not present

## 2020-03-21 DIAGNOSIS — M5386 Other specified dorsopathies, lumbar region: Secondary | ICD-10-CM | POA: Diagnosis not present

## 2020-03-21 DIAGNOSIS — M9905 Segmental and somatic dysfunction of pelvic region: Secondary | ICD-10-CM | POA: Diagnosis not present

## 2020-03-21 DIAGNOSIS — M9903 Segmental and somatic dysfunction of lumbar region: Secondary | ICD-10-CM | POA: Diagnosis not present

## 2020-03-24 DIAGNOSIS — M9905 Segmental and somatic dysfunction of pelvic region: Secondary | ICD-10-CM | POA: Diagnosis not present

## 2020-03-24 DIAGNOSIS — M5386 Other specified dorsopathies, lumbar region: Secondary | ICD-10-CM | POA: Diagnosis not present

## 2020-03-24 DIAGNOSIS — M25551 Pain in right hip: Secondary | ICD-10-CM | POA: Diagnosis not present

## 2020-03-24 DIAGNOSIS — M9903 Segmental and somatic dysfunction of lumbar region: Secondary | ICD-10-CM | POA: Diagnosis not present

## 2020-03-31 DIAGNOSIS — M25551 Pain in right hip: Secondary | ICD-10-CM | POA: Diagnosis not present

## 2020-03-31 DIAGNOSIS — M9905 Segmental and somatic dysfunction of pelvic region: Secondary | ICD-10-CM | POA: Diagnosis not present

## 2020-03-31 DIAGNOSIS — M5386 Other specified dorsopathies, lumbar region: Secondary | ICD-10-CM | POA: Diagnosis not present

## 2020-03-31 DIAGNOSIS — M9903 Segmental and somatic dysfunction of lumbar region: Secondary | ICD-10-CM | POA: Diagnosis not present

## 2020-04-15 ENCOUNTER — Other Ambulatory Visit: Payer: Self-pay

## 2020-04-15 ENCOUNTER — Ambulatory Visit: Payer: Federal, State, Local not specified - PPO | Admitting: Registered Nurse

## 2020-04-15 ENCOUNTER — Encounter: Payer: Self-pay | Admitting: Registered Nurse

## 2020-04-15 VITALS — BP 157/81 | HR 91 | Temp 98.6°F | Resp 18 | Ht 72.0 in | Wt 342.2 lb

## 2020-04-15 DIAGNOSIS — H60393 Other infective otitis externa, bilateral: Secondary | ICD-10-CM

## 2020-04-15 MED ORDER — OFLOXACIN 0.3 % OT SOLN: 5.0000 [drp] | Freq: Every day | OTIC | 0 refills | Status: DC

## 2020-04-15 MED ORDER — HYDROCORTISONE-ACETIC ACID 1-2 % OT SOLN: 3.0000 [drp] | Freq: Two times a day (BID) | OTIC | 0 refills | Status: AC

## 2020-04-15 MED ORDER — HYDROCORTISONE-ACETIC ACID 1-2 % OT SOLN: 3.0000 [drp] | Freq: Two times a day (BID) | OTIC | 0 refills | Status: DC

## 2020-04-15 MED ORDER — OFLOXACIN 0.3 % OT SOLN: 5.0000 [drp] | Freq: Every day | OTIC | 0 refills | Status: AC

## 2020-04-15 NOTE — Patient Instructions (Signed)
° ° ° °  If you have lab work done today you will be contacted with your lab results within the next 2 weeks.  If you have not heard from us then please contact us. The fastest way to get your results is to register for My Chart. ° ° °IF you received an x-ray today, you will receive an invoice from [facility]. Please contact [facility] at [phone] with questions or concerns regarding your invoice.  ° °IF you received labwork today, you will receive an invoice from [facility]. Please contact [facility] at [phone] with questions or concerns regarding your invoice.  ° °Our billing staff will not be able to assist you with questions regarding bills from these companies. ° °You will be contacted with the lab results as soon as they are available. The fastest way to get your results is to activate your My Chart account. Instructions are located on the last page of this paperwork. If you have not heard from us regarding the results in 2 weeks, please contact this office. °  ° ° ° °

## 2020-04-18 ENCOUNTER — Encounter: Payer: Self-pay | Admitting: Registered Nurse

## 2020-04-18 NOTE — Progress Notes (Signed)
Acute Office Visit  Subjective:    Patient ID: Stephen Hansen, male    DOB: Mar 30, 1991, 30 y.o.   MRN: [id number]  Chief Complaint  Patient presents with   Otitis Media    patient states he believe he has an ear infection in both ears. Per patient it has been there for 1.5 weeks  he states he has drops at home thats expired.    HPI Patient is in today for ear infection  Ongoing for around 10 days Gradually worsening Feels pressure and itching No deep pain No sinus pressure No URI symptoms No drainage from ears Has had this happen before - chart review shows otitis externa. States this feels the same.   Past Medical History:  Diagnosis Date   Allergy     No past surgical history on file.  Family History  Problem Relation Age of Onset   Hyperlipidemia Mother    Hypertension Mother    Diabetes Maternal Grandmother    Heart disease Maternal Grandmother    Hyperlipidemia Maternal Grandmother    Hypertension Maternal Grandmother    Cancer Maternal Grandfather    Hyperlipidemia Maternal Grandfather    Mental illness Maternal Grandfather    Diabetes Paternal Grandmother    Hyperlipidemia Paternal Grandmother    Cancer Paternal Grandfather    Hyperlipidemia Paternal Grandfather     Social History   Socioeconomic History   Marital status: Single    Spouse name: Not on file   Number of children: Not on file   Years of education: Not on file   Highest education level: Not on file  Occupational History   Not on file  Tobacco Use   Smoking status: Never Smoker   Smokeless tobacco: Never Used  Substance and Sexual Activity   Alcohol use: Yes    Alcohol/week: 3.0 standard drinks    Types: 3 Cans of beer per week   Drug use: No   Sexual activity: Not on file  Other Topics Concern   Not on file  Social History Narrative   Not on file   Social Determinants of Health   Financial Resource Strain:    Difficulty of Paying Living  Expenses: Not on file  Food Insecurity:    Worried About Running Out of Food in the Last Year: Not on file   Ran Out of Food in the Last Year: Not on file  Transportation Needs:    Lack of Transportation (Medical): Not on file   Lack of Transportation (Non-Medical): Not on file  Physical Activity:    Days of Exercise per Week: Not on file   Minutes of Exercise per Session: Not on file  Stress:    Feeling of Stress : Not on file  Social Connections:    Frequency of Communication with Friends and Family: Not on file   Frequency of Social Gatherings with Friends and Family: Not on file   Attends Religious Services: Not on file   Active Member of Clubs or Organizations: Not on file   Attends Banker Meetings: Not on file   Marital Status: Not on file  Intimate Partner Violence:    Fear of Current or Ex-Partner: Not on file   Emotionally Abused: Not on file   Physically Abused: Not on file   Sexually Abused: Not on file    Outpatient Medications Prior to Visit  Medication Sig Dispense Refill   Multiple Vitamin (MULTIVITAMIN) tablet Take 1 tablet by mouth daily.     No  facility-administered medications prior to visit.    Allergies  Allergen Reactions   Other     Review of Systems  Constitutional: Negative.   HENT: Negative.  Negative for ear discharge, ear pain and hearing loss.   Eyes: Negative.   Respiratory: Negative.   Cardiovascular: Negative.   Gastrointestinal: Negative.   Genitourinary: Negative.   Musculoskeletal: Negative.   Skin: Negative.   Neurological: Negative.   Psychiatric/Behavioral: Negative.        Objective:    Physical Exam Vitals and nursing note reviewed.  Constitutional:      Appearance: Normal appearance.  HENT:     Right Ear: Hearing and tympanic membrane normal. Swelling and tenderness present.     Left Ear: Hearing and tympanic membrane normal. Swelling and tenderness present.     Ears:     Comments:  Notable for otitis externa bilaterally. Cardiovascular:     Rate and Rhythm: Normal rate and regular rhythm.     Heart sounds: Normal heart sounds.  Pulmonary:     Effort: Pulmonary effort is normal. No respiratory distress.     Breath sounds: Normal breath sounds.  Skin:    General: Skin is warm and dry.  Neurological:     General: No focal deficit present.     Mental Status: He is alert and oriented to person, place, and time. Mental status is at baseline.  Psychiatric:        Mood and Affect: Mood normal.        Behavior: Behavior normal.        Thought Content: Thought content normal.        Judgment: Judgment normal.     BP (!) 157/81    Pulse 91    Temp 98.6 F (37 C) (Temporal)    Resp 18    Ht 6' (1.829 m)    Wt (!) 342 lb 3.2 oz (155.2 kg)    SpO2 100%    BMI 46.41 kg/m  Wt Readings from Last 3 Encounters:  04/15/20 (!) 342 lb 3.2 oz (155.2 kg)  12/30/19 (!) 310 lb (140.6 kg)  12/26/19 (!) 310 lb (140.6 kg)    There are no preventive care reminders to display for this patient.  There are no preventive care reminders to display for this patient.   No results found for: TSH Lab Results  Component Value Date   WBC 6.2 06/07/2014   HGB 16.0 06/07/2014   HCT 46.9 06/07/2014   MCV 87.8 06/07/2014   PLT 212 06/07/2014   Lab Results  Component Value Date   NA 136 06/07/2014   K 4.3 06/07/2014   CO2 28 06/07/2014   GLUCOSE 125 (H) 06/07/2014   BUN 14 06/07/2014   CREATININE 1.04 06/07/2014   BILITOT 0.8 06/07/2014   ALKPHOS 50 06/07/2014   AST 34 06/07/2014   ALT 33 06/07/2014   PROT 7.8 06/07/2014   ALBUMIN 4.2 06/07/2014   CALCIUM 9.3 06/07/2014   ANIONGAP 8 06/07/2014   No results found for: CHOL No results found for: HDL No results found for: LDLCALC No results found for: TRIG No results found for: CHOLHDL No results found for: FYBO1B     Assessment & Plan:   Problem List Items Addressed This Visit    None    Visit Diagnoses    Other  infective acute otitis externa of both ears    -  Primary   Relevant Medications   ofloxacin (FLOXIN OTIC) 0.3 % OTIC solution  acetic acid-hydrocortisone (VOSOL-HC) OTIC solution       Meds ordered this encounter  Medications   DISCONTD: ofloxacin (FLOXIN OTIC) 0.3 % OTIC solution    Sig: Place 5 drops into both ears daily.    Dispense:  5 mL    Refill:  0    Generic preferred    Order Specific Question:   Supervising Provider    Answer:   Neva Seat, JEFFREY R [[id number]]   DISCONTD: acetic acid-hydrocortisone (VOSOL-HC) OTIC solution    Sig: Place 3 drops into both ears 2 (two) times daily.    Dispense:  10 mL    Refill:  0    Order Specific Question:   Supervising Provider    Answer:   Neva Seat, JEFFREY R [[id number]]   DISCONTD: acetic acid-hydrocortisone (VOSOL-HC) OTIC solution    Sig: Place 3 drops into both ears 2 (two) times daily.    Dispense:  10 mL    Refill:  0    Order Specific Question:   Supervising Provider    Answer:   Neva Seat, JEFFREY R [[id number]]   DISCONTD: ofloxacin (FLOXIN OTIC) 0.3 % OTIC solution    Sig: Place 5 drops into both ears daily.    Dispense:  5 mL    Refill:  0    Order Specific Question:   Supervising Provider    Answer:   Neva Seat, JEFFREY R [[id number]]   ofloxacin (FLOXIN OTIC) 0.3 % OTIC solution    Sig: Place 5 drops into both ears daily.    Dispense:  5 mL    Refill:  0    Order Specific Question:   Supervising Provider    Answer:   Neva Seat, JEFFREY R [[id number]]   acetic acid-hydrocortisone (VOSOL-HC) OTIC solution    Sig: Place 3 drops into both ears 2 (two) times daily.    Dispense:  10 mL    Refill:  0    Order Specific Question:   Supervising Provider    Answer:   Neva Seat, JEFFREY R [[id number]]   PLAN  Ofloxacin 0.3% otic  vosol-hc  Return if symptoms worsen or fail to improve  May consider ENT referral if we get too consistent in these infections  Patient agreeable to plan  Patient encouraged to call clinic with any questions, comments, or  concerns.  Janeece Agee, NP

## 2021-07-28 DIAGNOSIS — F411 Generalized anxiety disorder: Secondary | ICD-10-CM | POA: Diagnosis not present

## 2021-08-08 DIAGNOSIS — M47896 Other spondylosis, lumbar region: Secondary | ICD-10-CM | POA: Diagnosis not present

## 2021-08-08 DIAGNOSIS — M545 Low back pain, unspecified: Secondary | ICD-10-CM | POA: Diagnosis not present

## 2021-09-06 DIAGNOSIS — Z0000 Encounter for general adult medical examination without abnormal findings: Secondary | ICD-10-CM | POA: Diagnosis not present

## 2021-09-27 DIAGNOSIS — F411 Generalized anxiety disorder: Secondary | ICD-10-CM | POA: Diagnosis not present

## 2021-10-10 DIAGNOSIS — B350 Tinea barbae and tinea capitis: Secondary | ICD-10-CM | POA: Diagnosis not present

## 2021-10-24 DIAGNOSIS — B350 Tinea barbae and tinea capitis: Secondary | ICD-10-CM | POA: Diagnosis not present

## 2021-11-02 DIAGNOSIS — R21 Rash and other nonspecific skin eruption: Secondary | ICD-10-CM | POA: Diagnosis not present

## 2021-11-23 DIAGNOSIS — L408 Other psoriasis: Secondary | ICD-10-CM | POA: Diagnosis not present

## 2022-01-01 DIAGNOSIS — H1031 Unspecified acute conjunctivitis, right eye: Secondary | ICD-10-CM | POA: Diagnosis not present

## 2022-01-05 DIAGNOSIS — Z6841 Body Mass Index (BMI) 40.0 and over, adult: Secondary | ICD-10-CM | POA: Diagnosis not present

## 2022-01-05 DIAGNOSIS — F411 Generalized anxiety disorder: Secondary | ICD-10-CM | POA: Diagnosis not present

## 2022-02-01 ENCOUNTER — Emergency Department (HOSPITAL_COMMUNITY): Payer: Federal, State, Local not specified - PPO

## 2022-02-01 ENCOUNTER — Other Ambulatory Visit: Payer: Self-pay

## 2022-02-01 ENCOUNTER — Emergency Department (HOSPITAL_COMMUNITY)
Admission: EM | Admit: 2022-02-01 | Discharge: 2022-02-01 | Disposition: A | Discharge status: Home / Self Care | Payer: Federal, State, Local not specified - PPO | Attending: Emergency Medicine | Admitting: Emergency Medicine

## 2022-02-01 DIAGNOSIS — R42 Dizziness and giddiness: Secondary | ICD-10-CM | POA: Diagnosis not present

## 2022-02-01 DIAGNOSIS — R519 Headache, unspecified: Secondary | ICD-10-CM | POA: Insufficient documentation

## 2022-02-01 DIAGNOSIS — R112 Nausea with vomiting, unspecified: Secondary | ICD-10-CM | POA: Diagnosis not present

## 2022-02-01 LAB — CBC WITH DIFFERENTIAL/PLATELET
Abs Immature Granulocytes: 0.01 10*3/uL (ref 0.00–0.07)
Basophils Absolute: 0 10*3/uL (ref 0.0–0.1)
Basophils Relative: 0 %
Eosinophils Absolute: 0.1 10*3/uL (ref 0.0–0.5)
Eosinophils Relative: 1 %
HCT: 41.2 % (ref 39.0–52.0)
Hemoglobin: 13.8 g/dL (ref 13.0–17.0)
Immature Granulocytes: 0 %
Lymphocytes Relative: 44 %
Lymphs Abs: 2.2 10*3/uL (ref 0.7–4.0)
MCH: 29.4 pg (ref 26.0–34.0)
MCHC: 33.5 g/dL (ref 30.0–36.0)
MCV: 87.8 fL (ref 80.0–100.0)
Monocytes Absolute: 0.6 10*3/uL (ref 0.1–1.0)
Monocytes Relative: 12 %
Neutro Abs: 2.1 10*3/uL (ref 1.7–7.7)
Neutrophils Relative %: 43 %
Platelets: 232 10*3/uL (ref 150–400)
RBC: 4.69 MIL/uL (ref 4.22–5.81)
RDW: 13.8 % (ref 11.5–15.5)
WBC: 4.9 10*3/uL (ref 4.0–10.5)
nRBC: 0 % (ref 0.0–0.2)

## 2022-02-01 LAB — BASIC METABOLIC PANEL
Anion gap: 7 (ref 5–15)
BUN: 10 mg/dL (ref 6–20)
CO2: 25 mmol/L (ref 22–32)
Calcium: 8.9 mg/dL (ref 8.9–10.3)
Chloride: 106 mmol/L (ref 98–111)
Creatinine, Ser: 1.12 mg/dL (ref 0.61–1.24)
GFR, Estimated: >60 mL/min (ref >60)
Glucose, Bld: 97 mg/dL (ref 70–99)
Potassium: 3.8 mmol/L (ref 3.5–5.1)
Sodium: 138 mmol/L (ref 135–145)

## 2022-02-01 MED ORDER — PROCHLORPERAZINE EDISYLATE 10 MG/2ML IJ SOLN
5.0000 mg | Freq: Once | INTRAMUSCULAR | Status: AC
  Administered 2022-02-01: 5 mg via INTRAVENOUS
  Filled 2022-02-01: qty 2

## 2022-02-01 MED ORDER — ACETAMINOPHEN 500 MG PO TABS
1000.0000 mg | ORAL_TABLET | Freq: Once | ORAL | Status: AC
  Administered 2022-02-01: 1000 mg via ORAL
  Filled 2022-02-01: qty 2

## 2022-02-01 MED ORDER — LACTATED RINGERS IV BOLUS
1000.0000 mL | Freq: Once | INTRAVENOUS | Status: AC
  Administered 2022-02-01: 1000 mL via INTRAVENOUS

## 2022-02-01 NOTE — Discharge Instructions (Addendum)
You can take ibuprofen 600 mg every 6 hours as needed for pain.  You can add Tylenol 650 mg as needed for additional pain.  You can add compazine 10 mg with 25 mg of benadryl every 6-8 hours as needed for severe headache, but do not drive if these medications make you sleepy. If you develop worsening headache despite medications, uncontrollable vomiting, fever, new weakness, new vision or hearing problems, or any other concerning symptoms, you should return to the ER.  Return to the ED if you experience: - Worsening of your headache - Vision or hearing loss - Difficulty walking - Fever or chills - Anything else that concerns you.

## 2022-02-01 NOTE — ED Provider Triage Note (Signed)
Emergency Medicine Provider Triage Evaluation Note  Stephen Hansen , a 31 y.o. male  was evaluated in triage.  Pt complains of headache. Work up this AM and noticed a severe headache with confusion, dizziness and n/v.  Headache has improved but still present.  Never had this type of headache before.  Felt fine last night.  No fever, chills, focal numbness or focal weakness  Review of Systems  Positive: As above Negative: As above  Physical Exam  BP (!) 142/99 (BP Location: Right Arm)   Pulse (!) 58   Temp 98.5 F (36.9 C) (Oral)   Resp 18   SpO2 97%  Gen:   Awake, no distress   Resp:  Normal effort  MSK:   Moves extremities without difficulty  Other:    Medical Decision Making  Medically screening exam initiated at 12:46 PM.  Appropriate orders placed.  Earsel Shouse was informed that the remainder of the evaluation will be completed by another provider, this initial triage assessment does not replace that evaluation, and the importance of remaining in the ED until their evaluation is complete.     Fayrene Helper, PA-C 02/01/22 1250

## 2022-02-01 NOTE — ED Provider Notes (Signed)
[facility] EMERGENCY DEPARTMENT Provider Note   CSN: [id number] Arrival date & time: 02/01/22  1154     History  Chief Complaint  Patient presents with   Headache    Stephen Hansen is a 31 y.o. male w/ no significant PMH who p/w headache.   Pain is bilateral, "all over," started at 9 AM this morning associated with sensitivity to light. Was rated 8/10 earlier that came on suddenly. Has had a few other headaches over the course of the last few days that were similar to this but not as severe and were relieved by Advil. +nausea with 1 episode of vomiting. No changes to vision but did note that the room was spinning, causing him to stumble slightly.  Also noticed slight lightheadedness.  Did not lose consciousness or fall.  Denies any asymmetric numbness tingling or weakness.  Headache is not worse with position whether bending over or laying down or straining, and patient has no neck pain or stiffness.  Did not strike his head or have any other head trauma recently.  Patient has no h/o migraines. On my evaluation patient had received tylenol in triage and headache now rated 2/10.  No fevers chills or recent illnesses. Still feels slightly nauseated.    Headache      Home Medications Prior to Admission medications   Medication Sig Start Date End Date Taking? Authorizing Provider  acetic acid-hydrocortisone (VOSOL-HC) OTIC solution Place 3 drops into both ears 2 (two) times daily. 04/15/20   Janeece Agee, NP  Multiple Vitamin (MULTIVITAMIN) tablet Take 1 tablet by mouth daily.    [provider]  ofloxacin (FLOXIN OTIC) 0.3 % OTIC solution Place 5 drops into both ears daily. 04/15/20   Janeece Agee, NP      Allergies    Other    Review of Systems   Review of Systems  Neurological:  Positive for headaches.  Review of systems negative for fevers/chills.  A 10 point review of systems was performed and is negative unless otherwise reported in  HPI.   Physical Exam Updated Vital Signs BP (!) 143/95 (BP Location: Left Arm)   Pulse 60   Temp 98.1 F (36.7 C) (Oral)   Resp 17   SpO2 98%  Physical Exam General: Normal appearing obese male, sitting up in bed.  HEENT: PERRLA, EOMI, Sclera anicteric, trachea midline, neck supple. Dry mucous membranes.  Cardiology: RRR, no murmurs/rubs/gallops. BL radial and DP pulses equal bilaterally.  Resp: Normal respiratory rate and effort. CTAB, no wheezes, rhonchi, crackles.  Abd: Soft, non-tender, non-distended. No rebound tenderness or guarding.  GU: Deferred. MSK: No peripheral edema or signs of trauma. Extremities without deformity or TTP. No cyanosis or clubbing. Skin: warm, dry. No rashes or lesions. Neuro: A&Ox4, CNs II-XII grossly intact. MAEs. Sensation grossly intact. Intact coordination. Negative brudzinski.  Psych: Normal mood and affect.   ED Results / Procedures / Treatments   Labs (all labs ordered are listed, but only abnormal results are displayed) Labs Reviewed  BASIC METABOLIC PANEL  CBC WITH DIFFERENTIAL/PLATELET    EKG None  Radiology CT Head Wo Contrast  Result Date: 02/01/2022 CLINICAL DATA:  Provided history: Head trauma, GCS=15, severe headache. Additional history provided: Migraine. EXAM: CT HEAD WITHOUT CONTRAST TECHNIQUE: Contiguous axial images were obtained from the base of the skull through the vertex without intravenous contrast. RADIATION DOSE REDUCTION: This exam was performed according to the departmental dose-optimization program which includes automated exposure control, adjustment of the mA and/or  kV according to patient size and/or use of iterative reconstruction technique. COMPARISON:  No pertinent prior exams available for comparison. FINDINGS: Brain: Cerebral volume is normal. There is no acute intracranial hemorrhage. No demarcated cortical infarct. No extra-axial fluid collection. No evidence of an intracranial mass. No midline shift. Vascular:  No hyperdense vessel. Skull: No fracture or aggressive osseous lesion. Sinuses/Orbits: No mass or acute finding within the imaged orbits. No significant paranasal sinus disease. IMPRESSION: Unremarkable noncontrast CT appearance of the brain. No evidence of acute intracranial abnormality. Electronically Signed   By: Jackey Loge D.O.   On: 02/01/2022 13:51    Procedures Procedures    Medications Ordered in ED Medications  acetaminophen (TYLENOL) tablet 1,000 mg (1,000 mg Oral Given 02/01/22 1645)  lactated ringers bolus 1,000 mL (0 mLs Intravenous Stopped 02/01/22 1820)  prochlorperazine (COMPAZINE) injection 5 mg (5 mg Intravenous Given 02/01/22 1727)    ED Course/ Medical Decision Making/ A&P                          Medical Decision Making Risk Prescription drug management.   For headache, consider serious or life threatening causes such as ICH, meningitis, brain mass. Patient with CT brain wo contrast without evidence of intracranial abnormality or bleed; patient received the CT scan approximately 5 hours after onset of headache, so low c/f SAH. Patient has had no fevers/chills and no nuchal rigidity to suggest meningitis. Consider IIH given patient's obesity but headache is not positional and he has no visual changes. Given throbbing nature of pain w/ nausea/light sensitivity, patient most likely with migraine headache that has since improved. Also consider dehydration, tension headache. Patient still complains of some nausea and has dry mucous membranes, given his lightheadedness earlier will treat with fluids and compazine for partial headache cocktail and reassess.    Clinical Course as of 02/01/22 1846  Thu Feb 01, 2022  1825 Patient has completed fluid and Compazine and states he feels improved. [HN]    Clinical Course User Index [HN] Loetta Rough, MD    After Tylenol, fluids, and Compazine, patient's headache now rated 1 out of 10 with improved nausea and sensitivity to  light.  Patient is sitting up in the chair, very well-appearing, stating he would like to go home now.  Hemodynamically stable.  Discussed with patient that he should follow-up with his primary care doctor within the week.  Patient given thorough discharge instructions and return precautions related to headache.  Patient reports understanding and has no following questions.  Dispo: DC         Final Clinical Impression(s) / ED Diagnoses Final diagnoses:  Nonintractable headache, unspecified chronicity pattern, unspecified headache type    Rx / DC Orders ED Discharge Orders     None         Loetta Rough, MD 02/01/22 1846

## 2022-02-01 NOTE — ED Triage Notes (Signed)
Pt states he woke from a nap with a severe headache, both sides, pressure, and sensitivity to light. No diagnosed hx of migraine.  Pt did have an episode of vomiting.  No sensitivity to noise.

## 2022-02-02 DIAGNOSIS — R110 Nausea: Secondary | ICD-10-CM | POA: Diagnosis not present

## 2022-02-02 DIAGNOSIS — F411 Generalized anxiety disorder: Secondary | ICD-10-CM | POA: Diagnosis not present

## 2022-09-13 DIAGNOSIS — L409 Psoriasis, unspecified: Secondary | ICD-10-CM | POA: Diagnosis not present

## 2022-09-13 DIAGNOSIS — Z79899 Other long term (current) drug therapy: Secondary | ICD-10-CM | POA: Diagnosis not present

## 2022-09-13 DIAGNOSIS — Z0000 Encounter for general adult medical examination without abnormal findings: Secondary | ICD-10-CM | POA: Diagnosis not present

## 2022-09-13 DIAGNOSIS — R030 Elevated blood-pressure reading, without diagnosis of hypertension: Secondary | ICD-10-CM | POA: Diagnosis not present

## 2022-09-13 DIAGNOSIS — M7989 Other specified soft tissue disorders: Secondary | ICD-10-CM | POA: Diagnosis not present

## 2022-09-13 DIAGNOSIS — F411 Generalized anxiety disorder: Secondary | ICD-10-CM | POA: Diagnosis not present

## 2022-10-24 DIAGNOSIS — F5112 Insufficient sleep syndrome: Secondary | ICD-10-CM | POA: Diagnosis not present

## 2022-10-24 DIAGNOSIS — G4726 Circadian rhythm sleep disorder, shift work type: Secondary | ICD-10-CM | POA: Diagnosis not present

## 2022-10-24 DIAGNOSIS — G4719 Other hypersomnia: Secondary | ICD-10-CM | POA: Diagnosis not present

## 2022-11-15 DIAGNOSIS — F419 Anxiety disorder, unspecified: Secondary | ICD-10-CM | POA: Diagnosis not present

## 2022-11-15 DIAGNOSIS — I10 Essential (primary) hypertension: Secondary | ICD-10-CM | POA: Diagnosis not present

## 2022-11-15 DIAGNOSIS — F329 Major depressive disorder, single episode, unspecified: Secondary | ICD-10-CM | POA: Diagnosis not present

## 2022-11-16 ENCOUNTER — Other Ambulatory Visit: Payer: Self-pay

## 2022-11-16 ENCOUNTER — Emergency Department (HOSPITAL_COMMUNITY)
Admission: EM | Admit: 2022-11-16 | Discharge: 2022-11-16 | Disposition: A | Discharge status: Home / Self Care | Payer: Federal, State, Local not specified - PPO | Attending: Emergency Medicine | Admitting: Emergency Medicine

## 2022-11-16 ENCOUNTER — Encounter (HOSPITAL_COMMUNITY): Payer: Self-pay

## 2022-11-16 DIAGNOSIS — F5101 Primary insomnia: Secondary | ICD-10-CM

## 2022-11-16 DIAGNOSIS — G4700 Insomnia, unspecified: Secondary | ICD-10-CM | POA: Diagnosis not present

## 2022-11-16 LAB — BASIC METABOLIC PANEL
Anion gap: 10 (ref 5–15)
BUN: 14 mg/dL (ref 6–20)
CO2: 24 mmol/L (ref 22–32)
Calcium: 9.2 mg/dL (ref 8.9–10.3)
Chloride: 104 mmol/L (ref 98–111)
Creatinine, Ser: 1.1 mg/dL (ref 0.61–1.24)
GFR, Estimated: >60 mL/min (ref >60)
Glucose, Bld: 123 mg/dL — ABNORMAL HIGH (ref 70–99)
Potassium: 3.7 mmol/L (ref 3.5–5.1)
Sodium: 138 mmol/L (ref 135–145)

## 2022-11-16 LAB — CBC
HCT: 43.3 % (ref 39.0–52.0)
Hemoglobin: 14 g/dL (ref 13.0–17.0)
MCH: 28.9 pg (ref 26.0–34.0)
MCHC: 32.3 g/dL (ref 30.0–36.0)
MCV: 89.5 fL (ref 80.0–100.0)
Platelets: 283 10*3/uL (ref 150–400)
RBC: 4.84 MIL/uL (ref 4.22–5.81)
RDW: 13.7 % (ref 11.5–15.5)
WBC: 4.9 10*3/uL (ref 4.0–10.5)
nRBC: 0 % (ref 0.0–0.2)

## 2022-11-16 MED ORDER — HYDROXYZINE HCL 25 MG PO TABS
ORAL_TABLET | ORAL | 0 refills | Status: AC
Start: 2022-11-16 — End: ?

## 2022-11-16 NOTE — ED Provider Notes (Signed)
[place] EMERGENCY DEPARTMENT AT [facility] Provider Note   CSN: [id number] Arrival date & time: 11/16/22  1201     History  Chief Complaint  Patient presents with   Insomnia   Anxiety    Stephen Hansen is a 32 y.o. male.  Patient complains of insomnia.  Patient reports that he does not sleep at night and then has problems with being sleepy and falling asleep during the day.  Patient saw his primary care doctor and he is scheduled for a sleep study on June 17.  Patient was seen at urgent care and they advised him that he needed to have the sleep study before taking any medicines for insomnia.  Patient reports he falls asleep and then wakes up.  Patient reports he does snore.  Patient has high blood pressure.  His primary care doctor is following this.  Admits to a significant amount of caffeine intake.  The history is provided by the patient. No language interpreter was used.  Insomnia This is a recurrent problem. The problem occurs constantly. Pertinent negatives include no chest pain and no abdominal pain. Nothing aggravates the symptoms. He has tried nothing for the symptoms. The treatment provided no relief.  Anxiety Pertinent negatives include no chest pain and no abdominal pain.       Home Medications Prior to Admission medications   Medication Sig Start Date End Date Taking? Authorizing Provider  hydrOXYzine (ATARAX) 25 MG tablet One tablet po qhs 11/16/22  Yes Cheron Schaumann K, PA-C  acetic acid-hydrocortisone (VOSOL-HC) OTIC solution Place 3 drops into both ears 2 (two) times daily. 04/15/20   Janeece Agee, NP  Multiple Vitamin (MULTIVITAMIN) tablet Take 1 tablet by mouth daily.    [provider]  ofloxacin (FLOXIN OTIC) 0.3 % OTIC solution Place 5 drops into both ears daily. 04/15/20   Janeece Agee, NP      Allergies    Patient has no active allergies.    Review of Systems   Review of Systems  Cardiovascular:  Negative for chest pain.   Gastrointestinal:  Negative for abdominal pain.  Psychiatric/Behavioral:  The patient has insomnia.   All other systems reviewed and are negative.   Physical Exam Updated Vital Signs BP (!) 152/90   Pulse 76   Temp 99 F (37.2 C)   Resp 16   SpO2 99%  Physical Exam Vitals and nursing note reviewed.  Constitutional:      Appearance: He is well-developed.  HENT:     Head: Normocephalic.     Right Ear: Tympanic membrane normal.     Left Ear: Tympanic membrane normal.     Mouth/Throat:     Mouth: Mucous membranes are moist.  Eyes:     Extraocular Movements: Extraocular movements intact.     Pupils: Pupils are equal, round, and reactive to light.  Cardiovascular:     Rate and Rhythm: Normal rate.  Pulmonary:     Effort: Pulmonary effort is normal.  Abdominal:     General: Abdomen is flat. There is no distension.  Musculoskeletal:        General: Normal range of motion.     Cervical back: Normal range of motion.  Skin:    General: Skin is warm.  Neurological:     General: No focal deficit present.     Mental Status: He is alert and oriented to person, place, and time.     ED Results / Procedures / Treatments   Labs (all labs  ordered are listed, but only abnormal results are displayed) Labs Reviewed  BASIC METABOLIC PANEL - Abnormal; Notable for the following components:      Result Value   Glucose, Bld 123 (*)    All other components within normal limits  CBC    EKG None  Radiology No results found.  Procedures Procedures    Medications Ordered in ED Medications - No data to display  ED Course/ Medical Decision Making/ A&P                             Medical Decision Making Patient complains of insomnia.  Patient is being followed by his primary care physician.  Is scheduled for sleep study given  Risk Prescription drug management. Risk Details: Patient given a prescription for Vistaril.  Patient is advised to keep appointment for sleep study he  is to call his primary care physician for recheck next week.  Patient is counseled on sleep hygiene and increasing exercise and decreasing caffeine.           Final Clinical Impression(s) / ED Diagnoses Final diagnoses:  Primary insomnia    Rx / DC Orders ED Discharge Orders          Ordered    hydrOXYzine (ATARAX) 25 MG tablet        11/16/22 1533           An After Visit Summary was printed and given to the patient.  An After Visit Summary was printed and given to the patient.    Elson Areas, [place] 11/16/22 1538    Charlynne Pander, MD 11/17/22 [phone]

## 2022-11-16 NOTE — Discharge Instructions (Addendum)
You need to have the sleep study as scheduled.  Avoid caffeine as this may increase your insomnia.  Increase your exercise this will frequently help with sleep.  See your physician for recheck next week

## 2022-11-16 NOTE — ED Triage Notes (Signed)
Patient reports insomnia and anxiety for "a while." Has sleep study scheduled. Also is taking medication for anxiety but is taking so much that he is falling asleep at work and his supervisor told him to go get checked out. Went to [facility] who told him they don't specialize in that and to come to the ED.

## 2022-11-20 DIAGNOSIS — G4726 Circadian rhythm sleep disorder, shift work type: Secondary | ICD-10-CM | POA: Diagnosis not present

## 2022-11-20 DIAGNOSIS — G4719 Other hypersomnia: Secondary | ICD-10-CM | POA: Diagnosis not present

## 2022-11-20 DIAGNOSIS — I10 Essential (primary) hypertension: Secondary | ICD-10-CM | POA: Diagnosis not present

## 2023-02-05 DIAGNOSIS — M545 Low back pain, unspecified: Secondary | ICD-10-CM | POA: Diagnosis not present

## 2023-02-05 DIAGNOSIS — M47896 Other spondylosis, lumbar region: Secondary | ICD-10-CM | POA: Diagnosis not present

## 2023-03-06 DIAGNOSIS — M9905 Segmental and somatic dysfunction of pelvic region: Secondary | ICD-10-CM | POA: Diagnosis not present

## 2023-03-06 DIAGNOSIS — M9903 Segmental and somatic dysfunction of lumbar region: Secondary | ICD-10-CM | POA: Diagnosis not present

## 2023-03-06 DIAGNOSIS — M5137 Other intervertebral disc degeneration, lumbosacral region: Secondary | ICD-10-CM | POA: Diagnosis not present

## 2023-03-06 DIAGNOSIS — M25551 Pain in right hip: Secondary | ICD-10-CM | POA: Diagnosis not present

## 2023-04-02 DIAGNOSIS — M25551 Pain in right hip: Secondary | ICD-10-CM | POA: Diagnosis not present

## 2023-04-02 DIAGNOSIS — M9914 Subluxation complex (vertebral) of sacral region: Secondary | ICD-10-CM | POA: Diagnosis not present

## 2023-04-02 DIAGNOSIS — M9905 Segmental and somatic dysfunction of pelvic region: Secondary | ICD-10-CM | POA: Diagnosis not present

## 2023-04-02 DIAGNOSIS — M9903 Segmental and somatic dysfunction of lumbar region: Secondary | ICD-10-CM | POA: Diagnosis not present

## 2023-04-04 DIAGNOSIS — M9903 Segmental and somatic dysfunction of lumbar region: Secondary | ICD-10-CM | POA: Diagnosis not present

## 2023-04-04 DIAGNOSIS — M25551 Pain in right hip: Secondary | ICD-10-CM | POA: Diagnosis not present

## 2023-04-04 DIAGNOSIS — M9914 Subluxation complex (vertebral) of sacral region: Secondary | ICD-10-CM | POA: Diagnosis not present

## 2023-04-04 DIAGNOSIS — M9905 Segmental and somatic dysfunction of pelvic region: Secondary | ICD-10-CM | POA: Diagnosis not present

## 2023-04-10 DIAGNOSIS — M9905 Segmental and somatic dysfunction of pelvic region: Secondary | ICD-10-CM | POA: Diagnosis not present

## 2023-04-10 DIAGNOSIS — M25551 Pain in right hip: Secondary | ICD-10-CM | POA: Diagnosis not present

## 2023-04-10 DIAGNOSIS — M9903 Segmental and somatic dysfunction of lumbar region: Secondary | ICD-10-CM | POA: Diagnosis not present

## 2023-04-10 DIAGNOSIS — M9914 Subluxation complex (vertebral) of sacral region: Secondary | ICD-10-CM | POA: Diagnosis not present

## 2023-04-16 DIAGNOSIS — M9905 Segmental and somatic dysfunction of pelvic region: Secondary | ICD-10-CM | POA: Diagnosis not present

## 2023-04-16 DIAGNOSIS — M9914 Subluxation complex (vertebral) of sacral region: Secondary | ICD-10-CM | POA: Diagnosis not present

## 2023-04-16 DIAGNOSIS — M25551 Pain in right hip: Secondary | ICD-10-CM | POA: Diagnosis not present

## 2023-04-16 DIAGNOSIS — M9903 Segmental and somatic dysfunction of lumbar region: Secondary | ICD-10-CM | POA: Diagnosis not present

## 2023-04-18 DIAGNOSIS — M9905 Segmental and somatic dysfunction of pelvic region: Secondary | ICD-10-CM | POA: Diagnosis not present

## 2023-04-18 DIAGNOSIS — M9903 Segmental and somatic dysfunction of lumbar region: Secondary | ICD-10-CM | POA: Diagnosis not present

## 2023-04-18 DIAGNOSIS — M9914 Subluxation complex (vertebral) of sacral region: Secondary | ICD-10-CM | POA: Diagnosis not present

## 2023-04-18 DIAGNOSIS — M25551 Pain in right hip: Secondary | ICD-10-CM | POA: Diagnosis not present

## 2023-11-26 DIAGNOSIS — M545 Low back pain, unspecified: Secondary | ICD-10-CM | POA: Diagnosis not present

## 2023-11-26 DIAGNOSIS — S86911A Strain of unspecified muscle(s) and tendon(s) at lower leg level, right leg, initial encounter: Secondary | ICD-10-CM | POA: Diagnosis not present

## 2023-11-29 DIAGNOSIS — S86911D Strain of unspecified muscle(s) and tendon(s) at lower leg level, right leg, subsequent encounter: Secondary | ICD-10-CM | POA: Diagnosis not present

## 2024-07-06 ENCOUNTER — Encounter (HOSPITAL_COMMUNITY): Payer: Self-pay

## 2024-07-06 ENCOUNTER — Emergency Department (HOSPITAL_COMMUNITY)
Admission: EM | Admit: 2024-07-06 | Discharge: 2024-07-06 | Disposition: A | Discharge status: Home / Self Care | Attending: Emergency Medicine | Admitting: Emergency Medicine

## 2024-07-06 ENCOUNTER — Other Ambulatory Visit: Payer: Self-pay

## 2024-07-06 DIAGNOSIS — R519 Headache, unspecified: Secondary | ICD-10-CM | POA: Diagnosis present

## 2024-07-06 MED ORDER — IBUPROFEN 400 MG PO TABS
600.0000 mg | ORAL_TABLET | Freq: Once | ORAL | Status: AC
  Administered 2024-07-06: 600 mg via ORAL
  Filled 2024-07-06: qty 1

## 2024-07-06 MED ORDER — PROCHLORPERAZINE MALEATE 5 MG PO TABS
10.0000 mg | ORAL_TABLET | Freq: Once | ORAL | Status: AC
  Administered 2024-07-06: 10 mg via ORAL
  Filled 2024-07-06: qty 2

## 2024-07-06 MED ORDER — DEXAMETHASONE 4 MG PO TABS
6.0000 mg | ORAL_TABLET | Freq: Once | ORAL | Status: AC
  Administered 2024-07-06: 6 mg via ORAL
  Filled 2024-07-06: qty 2

## 2024-07-06 MED ORDER — DIPHENHYDRAMINE HCL 25 MG PO CAPS
25.0000 mg | ORAL_CAPSULE | Freq: Once | ORAL | Status: AC
  Administered 2024-07-06: 25 mg via ORAL
  Filled 2024-07-06: qty 1

## 2024-07-06 NOTE — ED Triage Notes (Signed)
 Pt presents w/ c/o headache over R eye x 2 days. Reports meds aren't helping.

## 2024-07-06 NOTE — ED Notes (Signed)
..  Patient is A&Ox4 upon discharge. Patient verbalized understanding of discharge instructions and follow-up care. Patient ambulatory from ED with steady gait.

## 2024-07-06 NOTE — ED Provider Triage Note (Signed)
 Emergency Medicine Provider Triage Evaluation Note  Stephen Hansen , a 34 y.o. male  was evaluated in triage.  Pt complains of headache.  He reports having a headache for the past 2 days that has been mostly over his right eye.  He has been taking Tylenol  Extra Strength and Advil  dual action with little to no relief.  He reports he has been staring at a computer screen all day which has intensified his pain.  He denies any sensitivity to sound however has a little sensitivity to light.  Patient denies any vomiting or decrease in strength.  Patient denies any blurred vision or any other symptoms.  Review of Systems  Positive: Headache Negative:   Physical Exam  BP (!) 169/106 (BP Location: Right Arm)   Pulse 87   Temp 97.7 F (36.5 C) (Oral)   Resp 18   Ht 6' (1.829 m)   Wt (!) 145.2 kg   BMI 43.40 kg/m  Gen:   Awake, no distress   Resp:  Normal effort  MSK:   Moves extremities without difficulty  Other:    Medical Decision Making  Medically screening exam initiated at 8:49 PM.  Appropriate orders placed.  Stephen Hansen was informed that the remainder of the evaluation will be completed by another provider, this initial triage assessment does not replace that evaluation, and the importance of remaining in the ED until their evaluation is complete.     Stephen Hansen, [place] 07/06/24 2050

## 2024-07-06 NOTE — ED Provider Notes (Signed)
 " [place] EMERGENCY DEPARTMENT AT [facility] Provider Note   CSN: [id number] Arrival date & time: 07/06/24  1949     Patient presents with: Headache   Stephen Hansen is a 34 y.o. male.   34 year old male presenting with headache.  Patient notes intermittent headache symptoms for 2 days, describes right sided headache around the right eye that radiates posteriorly, describes this as throbbing.  Has been managing symptoms with Tylenol /ibuprofen  which has helped but has not entirely relieved the headache.  Reports some mild photophobia and occasional nausea but denies vision changes, hearing changes/noise sensitivity, dizziness or gait instability, vomiting, fever, neck stiffness, fall/head injury.        Prior to Admission medications  Medication Sig Start Date End Date Taking? Authorizing Provider  acetic acid -hydrocortisone  (VOSOL -HC) OTIC solution Place 3 drops into both ears 2 (two) times daily. 04/15/20   Kip Ade, NP  hydrOXYzine  (ATARAX ) 25 MG tablet One tablet po qhs 11/16/22   Sofia, Leslie K, PA-C  Multiple Vitamin (MULTIVITAMIN) tablet Take 1 tablet by mouth daily.    [provider]  ofloxacin  (FLOXIN  OTIC) 0.3 % OTIC solution Place 5 drops into both ears daily. 04/15/20   Kip Ade, NP    Allergies: Patient has no active allergies.    Review of Systems  Updated Vital Signs  Vitals:   07/06/24 1954 07/06/24 2047  BP: (!) 169/106   Pulse: 87   Resp: 18   Temp: 97.7 F (36.5 C)   TempSrc: Oral   Weight:  (!) 145.2 kg  Height:  6' (1.829 m)     Physical Exam Vitals and nursing note reviewed.  Constitutional:      General: He is not in acute distress.    Appearance: Normal appearance. He is not ill-appearing or toxic-appearing.  HENT:     Head: Normocephalic and atraumatic.  Eyes:     Extraocular Movements: Extraocular movements intact.     Pupils: Pupils are equal, round, and reactive to light.  Neck:      Comments:  Mild tenderness noted in the region of the trapezius on the right side, no midline spinous process tenderness Cardiovascular:     Rate and Rhythm: Normal rate and regular rhythm.     Heart sounds: Normal heart sounds.  Pulmonary:     Effort: Pulmonary effort is normal.     Breath sounds: Normal breath sounds.  Musculoskeletal:     Cervical back: Normal range of motion and neck supple. Tenderness present. No rigidity.     Comments: Moves all extremities spontaneously without difficulty 5/5 strength against resistance of bilateral upper and lower extremities Grip strength symmetric and intact  Skin:    General: Skin is warm and dry.  Neurological:     General: No focal deficit present.     Mental Status: He is alert and oriented to person, place, and time.     Comments: Facial expressions are symmetric and intact without facial droop Normal cerebellar testing without ataxia, including finger-nose Negative pronator drift No appreciable sensory deficits/weaknesses     (all labs ordered are listed, but only abnormal results are displayed) Labs Reviewed - No data to display  EKG: None  Radiology: No results found.   Procedures   Medications Ordered in the ED  prochlorperazine  (COMPAZINE ) tablet 10 mg (10 mg Oral Given 07/06/24 2157)  diphenhydrAMINE  (BENADRYL ) capsule 25 mg (25 mg Oral Given 07/06/24 2157)  dexamethasone  (DECADRON ) tablet 6 mg (6 mg Oral Given 07/06/24 2158)  ibuprofen  (ADVIL ) tablet 600 mg (600 mg Oral Given 07/06/24 2332)                                    Medical Decision Making This patient presents to the ED for concern of headache, this involves an extensive number of treatment options, and is a complaint that carries with it a high risk of complications and morbidity.  The differential diagnosis includes migraine headache, tension headache, cluster headache, stroke versus TIA, intracranial hemorrhage/mass   Additional history obtained:  Additional  history obtained from record review External records from outside source obtained and reviewed including prior ED notes   Cardiac Monitoring: / EKG:  The patient was maintained on a cardiac monitor.  I personally viewed and interpreted the cardiac monitored which showed an underlying rhythm of: NSR  Problem List / ED Course / Critical interventions / Medication management   I ordered medication including Compazine /Benadryl /Decadron /ibuprofen  for migraine cocktail Reevaluation of the patient after these medicines showed that the patient improved I have reviewed the patients home medicines and have made adjustments as needed   Test / Admission - Considered:  Physical exam is largely unremarkable as above, no focal neurologic deficits on exam, patient does have some mild tenderness in the region of the right trapezius but no midline C-spine tenderness/neck rigidity/stiffness.  Symptoms are most consistent with migraine versus tension headache versus cluster headache, will administer migraine cocktail and reassess.  At this time I do not feel that labs/imaging are warranted, patient does not have any headache red flag signs/symptoms. Patient reports improvement in headache symptoms after administration of migraine cocktail, but headache has not entirely resolved.  Offered Toradol  but patient declines as he does not want any IM medications, will administer ibuprofen  instead. At reassessment, patient notes continued improvement in his symptoms.  Again, given reassuring physical exam findings and response to migraine cocktail I do not feel that further labs/imaging are warranted at this time. Return precautions discussed, patient voiced understanding is in agreement this plan, he is appropriate for discharge at this time.    Risk Prescription drug management.        Final diagnoses:  Nonintractable headache, unspecified chronicity pattern, unspecified headache type    ED Discharge Orders      None          Glendia Rocky LOISE DEVONNA 07/06/24 2355    Rogelia Jerilynn RAMAN, MD 07/10/24 1445  "

## 2024-07-06 NOTE — Discharge Instructions (Signed)
 Continue Tylenol /Ibuprofen /Excedrin migraine as needed if your headache symptoms return.  If your symptoms persist, discuss this with your primary care provider.  If your symptoms worsen, return to the emergency department.
